# Patient Record
Sex: Female | Born: 1955 | Race: Black or African American | Hispanic: No | Marital: Single | Smoking: Current every day smoker
Health system: Southern US, Community
[De-identification: ages and names within clinical notes are randomized; demographics above are authoritative.]

## PROBLEM LIST (undated history)

## (undated) DIAGNOSIS — F251 Schizoaffective disorder, depressive type: Secondary | ICD-10-CM

## (undated) DIAGNOSIS — Z789 Other specified health status: Secondary | ICD-10-CM

## (undated) DIAGNOSIS — F99 Mental disorder, not otherwise specified: Secondary | ICD-10-CM

## (undated) DIAGNOSIS — D573 Sickle-cell trait: Secondary | ICD-10-CM

## (undated) HISTORY — PX: NO PAST SURGERIES: SHX2092

---

## 2000-08-01 ENCOUNTER — Emergency Department (HOSPITAL_COMMUNITY): Admission: EM | Admit: 2000-08-01 | Discharge: 2000-08-01 | Payer: Self-pay | Admitting: Emergency Medicine

## 2001-07-31 ENCOUNTER — Inpatient Hospital Stay (HOSPITAL_COMMUNITY): Admission: AD | Admit: 2001-07-31 | Discharge: 2001-07-31 | Payer: Self-pay | Admitting: *Deleted

## 2001-08-02 ENCOUNTER — Emergency Department (HOSPITAL_COMMUNITY): Admission: EM | Admit: 2001-08-02 | Discharge: 2001-08-02 | Payer: Self-pay | Admitting: Emergency Medicine

## 2001-10-18 ENCOUNTER — Encounter: Payer: Self-pay | Admitting: Emergency Medicine

## 2001-10-18 ENCOUNTER — Emergency Department (HOSPITAL_COMMUNITY): Admission: EM | Admit: 2001-10-18 | Discharge: 2001-10-18 | Payer: Self-pay | Admitting: Emergency Medicine

## 2004-08-09 ENCOUNTER — Emergency Department (HOSPITAL_COMMUNITY): Admission: EM | Admit: 2004-08-09 | Discharge: 2004-08-09 | Payer: Self-pay | Admitting: Emergency Medicine

## 2005-06-07 ENCOUNTER — Emergency Department (HOSPITAL_COMMUNITY): Admission: EM | Admit: 2005-06-07 | Discharge: 2005-06-07 | Payer: Self-pay | Admitting: Emergency Medicine

## 2008-08-17 ENCOUNTER — Emergency Department (HOSPITAL_COMMUNITY): Admission: EM | Admit: 2008-08-17 | Discharge: 2008-08-17 | Payer: Self-pay | Admitting: Emergency Medicine

## 2009-10-21 ENCOUNTER — Emergency Department (HOSPITAL_COMMUNITY): Admission: EM | Admit: 2009-10-21 | Discharge: 2009-10-21 | Payer: Self-pay | Admitting: Emergency Medicine

## 2010-08-09 LAB — WET PREP, GENITAL
Clue Cells Wet Prep HPF POC: NONE SEEN
Yeast Wet Prep HPF POC: NONE SEEN

## 2010-08-09 LAB — GC/CHLAMYDIA PROBE AMP, GENITAL
Chlamydia, DNA Probe: NEGATIVE
GC Probe Amp, Genital: NEGATIVE

## 2012-07-19 ENCOUNTER — Emergency Department (HOSPITAL_COMMUNITY)
Admission: EM | Admit: 2012-07-19 | Discharge: 2012-07-19 | Disposition: A | Discharge status: Home / Self Care | Payer: Medicare HMO | Attending: Emergency Medicine | Admitting: Emergency Medicine

## 2012-07-19 ENCOUNTER — Encounter (HOSPITAL_COMMUNITY): Payer: Self-pay | Admitting: Emergency Medicine

## 2012-07-19 DIAGNOSIS — N898 Other specified noninflammatory disorders of vagina: Secondary | ICD-10-CM | POA: Insufficient documentation

## 2012-07-19 DIAGNOSIS — M25519 Pain in unspecified shoulder: Secondary | ICD-10-CM | POA: Insufficient documentation

## 2012-07-19 DIAGNOSIS — F17200 Nicotine dependence, unspecified, uncomplicated: Secondary | ICD-10-CM | POA: Insufficient documentation

## 2012-07-19 DIAGNOSIS — M542 Cervicalgia: Secondary | ICD-10-CM | POA: Insufficient documentation

## 2012-07-19 DIAGNOSIS — G8929 Other chronic pain: Secondary | ICD-10-CM | POA: Insufficient documentation

## 2012-07-19 DIAGNOSIS — Z8659 Personal history of other mental and behavioral disorders: Secondary | ICD-10-CM | POA: Insufficient documentation

## 2012-07-19 DIAGNOSIS — Z862 Personal history of diseases of the blood and blood-forming organs and certain disorders involving the immune mechanism: Secondary | ICD-10-CM | POA: Insufficient documentation

## 2012-07-19 HISTORY — DX: Sickle-cell trait: D57.3

## 2012-07-19 LAB — URINALYSIS, ROUTINE W REFLEX MICROSCOPIC
Bilirubin Urine: NEGATIVE
Glucose, UA: NEGATIVE mg/dL
Ketones, ur: NEGATIVE mg/dL
Nitrite: NEGATIVE
Protein, ur: NEGATIVE mg/dL
Specific Gravity, Urine: 1.011 (ref 1.005–1.030)
Urobilinogen, UA: 1 mg/dL (ref 0.0–1.0)
pH: 6 (ref 5.0–8.0)

## 2012-07-19 LAB — URINE MICROSCOPIC-ADD ON

## 2012-07-19 NOTE — ED Notes (Addendum)
Pt c/o r shoulder and neck x1 week, reports pain is a burning sensation and is 5/10. Denies chest pain or SOB.  Pt also c/o white discharge that has turned to yellow in her vaginal area x3 weeks. Denies any difficulty urinating.
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
## 2012-07-19 NOTE — ED Notes (Signed)
MD at bedside. 

## 2012-07-20 LAB — URINE CULTURE

## 2012-07-21 ENCOUNTER — Inpatient Hospital Stay (HOSPITAL_COMMUNITY)
Admission: AD | Admit: 2012-07-21 | Discharge: 2012-07-21 | Disposition: A | Discharge status: Home / Self Care | Payer: Medicare Other | Source: Ambulatory Visit | Attending: Obstetrics & Gynecology | Admitting: Obstetrics & Gynecology

## 2012-07-21 ENCOUNTER — Encounter (HOSPITAL_COMMUNITY): Payer: Self-pay | Admitting: *Deleted

## 2012-07-21 DIAGNOSIS — A5901 Trichomonal vulvovaginitis: Secondary | ICD-10-CM | POA: Insufficient documentation

## 2012-07-21 DIAGNOSIS — N949 Unspecified condition associated with female genital organs and menstrual cycle: Secondary | ICD-10-CM | POA: Insufficient documentation

## 2012-07-21 HISTORY — DX: Mental disorder, not otherwise specified: F99

## 2012-07-21 HISTORY — DX: Other specified health status: Z78.9

## 2012-07-21 HISTORY — DX: Schizoaffective disorder, depressive type: F25.1

## 2012-07-21 LAB — URINALYSIS, ROUTINE W REFLEX MICROSCOPIC
Bilirubin Urine: NEGATIVE
Leukocytes, UA: NEGATIVE
Nitrite: NEGATIVE
Specific Gravity, Urine: 1.01 (ref 1.005–1.030)
pH: 7 (ref 5.0–8.0)

## 2012-07-21 LAB — WET PREP, GENITAL

## 2012-07-21 MED ORDER — METRONIDAZOLE 500 MG PO TABS: 500.0000 mg | ORAL_TABLET | Freq: Two times a day (BID) | ORAL | Status: DC

## 2012-07-21 NOTE — MAU Provider Note (Signed)
History     CSN: [id number]  Arrival date and time: 07/21/12 0758   None     Chief Complaint  Patient presents with  . Vaginal Discharge   HPI Jacqueline Hunter is 57 y.o. G3P0 presenting with vaginal discharge for 1 month.  Described as white with intermittent itching and odor.  States she was last sexually active 4 months ago with a new partner.  Last menstrual cycle 17 years ago. Denies hysterectomy. Last pap smear was 2 years ago.  She was seen at [facility] recently with same sxs but pelvic exam was very uncomfortable for her and MD was unable to complete.     Past Medical History  Diagnosis Date  . Sickle cell trait   . Medical history non-contributory   . Mental disorder   . Schizophrenia, schizo-affective type, depressed     Past Surgical History  Procedure Laterality Date  . No past surgeries      History reviewed. No pertinent family history.  History  Substance Use Topics  . Smoking status: Current Every Day Smoker -- 1.00 packs/day  . Smokeless tobacco: Not on file  . Alcohol Use: Yes    Allergies: No Known Allergies  No prescriptions prior to admission    Review of Systems  Constitutional: Negative.   HENT: Negative.   Respiratory: Negative.   Cardiovascular: Negative.   Gastrointestinal: Negative for abdominal pain.  Genitourinary: Negative for dysuria and urgency.       + for vaginal discharge with intermittent odor and itching   Physical Exam   Blood pressure 136/70, pulse 98, temperature 98.3 F (36.8 C), temperature source Oral, resp. rate 18, height 5\' 2"  (1.575 m), weight 105 lb 2 oz (47.684 kg).  Physical Exam  Constitutional: She appears well-developed and well-nourished. No distress.  HENT:  Head: Normocephalic.  Neck: Normal range of motion.  Cardiovascular: Normal rate.   Respiratory: Effort normal.  GI: Soft. She exhibits no distension and no mass. There is no tenderness. There is no rebound and no guarding.  Genitourinary: There is no  rash, tenderness or lesion on the right labia. There is no rash, tenderness or lesion on the left labia. Uterus is not enlarged and not tender. Cervix exhibits no motion tenderness, no discharge and no friability. Right adnexum displays no mass, no tenderness and no fullness. Left adnexum displays no mass, no tenderness and no fullness. No erythema, tenderness or bleeding around the vagina. Vaginal discharge (frothy discharge with odor) found.   Results for orders placed during the hospital encounter of 07/21/12 (from the past 24 hour(s))  URINALYSIS, ROUTINE W REFLEX MICROSCOPIC     Status: Abnormal   Collection Time    07/21/12  8:55 AM      Result Value Range   Color, Urine YELLOW  YELLOW   APPearance CLEAR  CLEAR   Specific Gravity, Urine 1.010  1.005 - 1.030   pH 7.0  5.0 - 8.0   Glucose, UA NEGATIVE  NEGATIVE mg/dL   Hgb urine dipstick TRACE (*) NEGATIVE   Bilirubin Urine NEGATIVE  NEGATIVE   Ketones, ur NEGATIVE  NEGATIVE mg/dL   Protein, ur NEGATIVE  NEGATIVE mg/dL   Urobilinogen, UA 0.2  0.0 - 1.0 mg/dL   Nitrite NEGATIVE  NEGATIVE   Leukocytes, UA NEGATIVE  NEGATIVE  WET PREP, GENITAL     Status: Abnormal   Collection Time    07/21/12  8:55 AM      Result Value Range   Yeast Wet  Prep HPF POC NONE SEEN  NONE SEEN   Trich, Wet Prep RARE (*) NONE SEEN   Clue Cells Wet Prep HPF POC RARE (*) NONE SEEN   WBC, Wet Prep HPF POC MODERATE (*) NONE SEEN  URINE MICROSCOPIC-ADD ON     Status: Abnormal   Collection Time    07/21/12  8:55 AM      Result Value Range   Squamous Epithelial / LPF FEW (*) RARE   RBC / HPF 0-2  <3 RBC/hpf    MAU Course  Procedures  GC/CHL culture to lab  MDM  Assessment and Plan  A:  Trichomonal vaginitis  P:  Rx for Flagyl X 1 week  ETOH warning     Strongly encouraged condoms for sexual activity      Instructed patient that her partner needs treatment before resuming intercourse to prevent re-infection   KEY,EVE M 07/21/2012, 8:38 AM

## 2012-07-21 NOTE — MAU Provider Note (Signed)
Attestation of Attending Supervision of Advanced Practitioner (PA/CNM/NP): Evaluation and management procedures were performed by the Advanced Practitioner under my supervision and collaboration.  I have reviewed the Advanced Practitioner's note and chart, and I agree with the management and plan.  Karin Pinedo, MD, FACOG Attending Obstetrician & Gynecologist Faculty Practice, [facility]  

## 2012-07-21 NOTE — MAU Note (Signed)
Pt. States that she has been having a white discharge for the last month.  She is having a small amount of vaginal pain and itching.

## 2012-07-23 LAB — GC/CHLAMYDIA PROBE AMP: GC Probe RNA: NEGATIVE

## 2012-08-01 ENCOUNTER — Emergency Department (INDEPENDENT_AMBULATORY_CARE_PROVIDER_SITE_OTHER)
Admission: EM | Admit: 2012-08-01 | Discharge: 2012-08-01 | Disposition: A | Discharge status: Home / Self Care | Payer: Medicare Other | Source: Home / Self Care

## 2012-08-01 ENCOUNTER — Encounter (HOSPITAL_COMMUNITY): Payer: Self-pay | Admitting: Emergency Medicine

## 2012-08-01 DIAGNOSIS — Z5189 Encounter for other specified aftercare: Secondary | ICD-10-CM | POA: Diagnosis not present

## 2012-08-01 DIAGNOSIS — S46812D Strain of other muscles, fascia and tendons at shoulder and upper arm level, left arm, subsequent encounter: Secondary | ICD-10-CM

## 2012-08-01 DIAGNOSIS — S161XXD Strain of muscle, fascia and tendon at neck level, subsequent encounter: Secondary | ICD-10-CM

## 2012-08-01 MED ORDER — MELOXICAM 15 MG PO TABS: 15.0000 mg | ORAL_TABLET | Freq: Every day | ORAL | Status: DC

## 2012-08-01 MED ORDER — TRAMADOL HCL 50 MG PO TABS: 50.0000 mg | ORAL_TABLET | Freq: Four times a day (QID) | ORAL | Status: DC | PRN

## 2012-08-01 NOTE — ED Provider Notes (Signed)
Medical screening examination/treatment/procedure(s) were performed by non-physician practitioner and as supervising physician I was immediately available for consultation/collaboration.  Leslee Home, M.D.   Reuben Likes, MD 08/01/12 2056

## 2012-08-01 NOTE — ED Provider Notes (Signed)
History     CSN: [id number]  Arrival date & time 08/01/12  1133   First MD Initiated Contact with Patient 08/01/12 1158      Chief Complaint  Patient presents with  . Arm Pain  . Shoulder Pain    (Consider location/radiation/quality/duration/timing/severity/associated sxs/prior treatment) HPI Comments: 57 year old female who appears 10 years older the stated age presents to the urgent care for her chronic right neck and shoulder pain. This began approximately 20 years ago but she has intermittent flareups. She was seen at [facility] one to 2 weeks ago for the same problem. She complains of pain in the right lateral cervical musculature and trapezius muscle. The pain is worse when rotating head to the right and flexing neck. She is able to raise her arm over her head but that also causes pain. She denies new injury or trauma.  Patient is a 57 y.o. female presenting with arm pain and shoulder pain. The history is provided by the patient.  Arm Pain This is a chronic problem. The problem occurs every several days. The problem has been gradually worsening. Pertinent negatives include no chest pain, no abdominal pain, no headaches and no shortness of breath. The symptoms are aggravated by exertion. Nothing relieves the symptoms.  Shoulder Pain Pertinent negatives include no chest pain, no abdominal pain, no headaches and no shortness of breath.    Past Medical History  Diagnosis Date  . Sickle cell trait   . Medical history non-contributory   . Mental disorder   . Schizophrenia, schizo-affective type, depressed     Past Surgical History  Procedure Laterality Date  . No past surgeries      No family history on file.  History  Substance Use Topics  . Smoking status: Current Every Day Smoker -- 1.00 packs/day  . Smokeless tobacco: Not on file  . Alcohol Use: Yes    OB History   Grav Para Term Preterm Abortions TAB SAB Ect Mult Living   3         3      Review of  Systems  Constitutional: Negative for fever, chills and activity change.  HENT: Positive for neck pain. Negative for nosebleeds, sore throat, trouble swallowing and ear discharge.   Respiratory: Negative.  Negative for shortness of breath.   Cardiovascular: Negative.  Negative for chest pain.  Gastrointestinal: Negative for abdominal pain.  Musculoskeletal:       As per HPI  Skin: Negative for color change, pallor and rash.  Neurological: Negative.  Negative for headaches.  Psychiatric/Behavioral: Positive for behavioral problems and agitation. The patient is nervous/anxious.     Allergies  Review of patient's allergies indicates no known allergies.  Home Medications   Current Outpatient Rx  Name  Route  Sig  Dispense  Refill  . meloxicam (MOBIC) 15 MG tablet   Oral   Take 1 tablet (15 mg total) by mouth daily.   14 tablet   0   . metroNIDAZOLE (FLAGYL) 500 MG tablet   Oral   Take 1 tablet (500 mg total) by mouth 2 (two) times daily.   14 tablet   0   . traMADol (ULTRAM) 50 MG tablet   Oral   Take 1 tablet (50 mg total) by mouth every 6 (six) hours as needed for pain.   15 tablet   0     BP 155/86  Pulse 80  Temp(Src) 98.4 F (36.9 C) (Oral)  Resp 18  SpO2 99%  Physical Exam  Nursing note and vitals reviewed. Constitutional: She is oriented to person, place, and time. She appears well-developed and well-nourished. No distress.  HENT:  Head: Normocephalic and atraumatic.  Right Ear: External ear normal.  Left Ear: External ear normal.  Mouth/Throat: Oropharynx is clear and moist. No oropharyngeal exudate.  Eyes: EOM are normal. Pupils are equal, round, and reactive to light.  Neck: Normal range of motion. Neck supple.  Cardiovascular: Normal rate and normal heart sounds.   Pulmonary/Chest: Effort normal and breath sounds normal. No respiratory distress.  Lymphadenopathy:    She has no cervical adenopathy.  Neurological: She is alert and oriented to person,  place, and time. No cranial nerve deficit.  Skin: Skin is warm and dry.  Psychiatric: Her speech is normal. Her mood appears anxious. Her affect is labile.  Histrionic,crying at times. emotionally labile    ED Course  Procedures (including critical care time)  Labs Reviewed - No data to display No results found.   1. Cervicalgia   2. Trapezius strain, left, subsequent encounter   3. Cervical muscle strain, subsequent encounter       MDM  Mobic 15 mg one daily when necessary pain take with food Tramadol 50 mg one every 4-6 hours when necessary pain #15 He must obtain a primary care doctor to help with her chronic pain, health maintenance and other problems. Apply heat to the neck muscles to help with pain and relaxation. Stop performing the job duties and working but she states makes the pain worse. The         Hayden Rasmussen, NP 08/01/12 1427

## 2012-08-01 NOTE — ED Notes (Signed)
Pt now states chest pain and possible poisoning... I have made Pt aware that of this information was going to be passed on the her Shacoria Latif and that they would be asking further questions about these changes. Pt's CMA and NP are informed of Pt's complains.
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
## 2012-08-03 ENCOUNTER — Telehealth (HOSPITAL_COMMUNITY): Payer: Self-pay | Admitting: *Deleted

## 2012-08-03 NOTE — ED Notes (Signed)
Pt. called on VM and asked for a refill of her pain medication. Also btated it does not help very much.  I called pt. back and told her we don't do refills because we are not a primary care facility.  Pt. instructed to get a PCP. States she has an appointment with [facility] 4/9. I told her to call them and tell them she is having pain. If they can't see you sooner then come back for a recheck. Pt. voiced understanding. Vassie Moselle 08/03/2012

## 2012-08-04 ENCOUNTER — Encounter (HOSPITAL_COMMUNITY): Payer: Self-pay | Admitting: Emergency Medicine

## 2012-08-04 ENCOUNTER — Emergency Department (HOSPITAL_COMMUNITY)
Admission: EM | Admit: 2012-08-04 | Discharge: 2012-08-04 | Disposition: A | Discharge status: Home / Self Care | Payer: Medicare Other | Attending: Emergency Medicine | Admitting: Emergency Medicine

## 2012-08-04 DIAGNOSIS — M62838 Other muscle spasm: Secondary | ICD-10-CM | POA: Insufficient documentation

## 2012-08-04 DIAGNOSIS — F17200 Nicotine dependence, unspecified, uncomplicated: Secondary | ICD-10-CM | POA: Diagnosis not present

## 2012-08-04 DIAGNOSIS — Z862 Personal history of diseases of the blood and blood-forming organs and certain disorders involving the immune mechanism: Secondary | ICD-10-CM | POA: Insufficient documentation

## 2012-08-04 DIAGNOSIS — M542 Cervicalgia: Secondary | ICD-10-CM | POA: Insufficient documentation

## 2012-08-04 DIAGNOSIS — Z79899 Other long term (current) drug therapy: Secondary | ICD-10-CM | POA: Insufficient documentation

## 2012-08-04 DIAGNOSIS — Z8659 Personal history of other mental and behavioral disorders: Secondary | ICD-10-CM | POA: Insufficient documentation

## 2012-08-04 DIAGNOSIS — M5380 Other specified dorsopathies, site unspecified: Secondary | ICD-10-CM | POA: Diagnosis not present

## 2012-08-04 MED ORDER — CYCLOBENZAPRINE HCL 10 MG PO TABS: 10.0000 mg | ORAL_TABLET | Freq: Two times a day (BID) | ORAL | Status: DC | PRN

## 2012-08-04 NOTE — ED Provider Notes (Signed)
History  This chart was scribed for non-physician practitioner working with Dierdre Forth, PA-C/Brian Alanda Amass, MD, by Candelaria Stagers, ED Scribe. This patient was seen in room TR08C/TR08C and the patient's care was started at 5:54 PM   CSN: [id number]  Arrival date & time 08/04/12  1409   First MD Initiated Contact with Patient 08/04/12 1718      Chief Complaint  Patient presents with  . Shoulder Pain     The history is provided by the patient. No language interpreter was used.   Jacqueline Hunter is a 57 y.o. female who presents to the Emergency Department complaining continued right shoulder pain that started about two months ago after she hit the shoulder on a door frame.  She is also experiencing left sided neck pain.  She reports the pain subsided, but returned about one month ago.  She has been seen in the ED twice for the sx and has been prescribed Tramadol and Meloxicam with no relief.  She has also taken tylenol with no relief.  Pt reports she has not taken the Meloxicam consistently.  The pain is worse when turning her head.          Past Medical History  Diagnosis Date  . Sickle cell trait   . Medical history non-contributory   . Mental disorder   . Schizophrenia, schizo-affective type, depressed     Past Surgical History  Procedure Laterality Date  . No past surgeries      No family history on file.  History  Substance Use Topics  . Smoking status: Current Every Day Smoker -- 1.00 packs/day  . Smokeless tobacco: Not on file  . Alcohol Use: Yes    OB History   Grav Para Term Preterm Abortions TAB SAB Ect Mult Living   3         3      Review of Systems  HENT: Positive for neck pain (left sided neck pain).   Musculoskeletal: Positive for arthralgias (right shoulder pain).  All other systems reviewed and are negative.    Allergies  Review of patient's allergies indicates no known allergies.  Home Medications   Current Outpatient Rx  Name  Route   Sig  Dispense  Refill  . meloxicam (MOBIC) 15 MG tablet   Oral   Take 1 tablet (15 mg total) by mouth daily.   14 tablet   0   . traMADol (ULTRAM) 50 MG tablet   Oral   Take 1 tablet (50 mg total) by mouth every 6 (six) hours as needed for pain.   15 tablet   0   . cyclobenzaprine (FLEXERIL) 10 MG tablet   Oral   Take 1 tablet (10 mg total) by mouth 2 (two) times daily as needed for muscle spasms.   20 tablet   0     BP 166/110  Pulse 92  Temp(Src) 98.2 F (36.8 C) (Oral)  Resp 20  SpO2 98%  Physical Exam  Nursing note and vitals reviewed. Constitutional: She is oriented to person, place, and time. She appears well-developed and well-nourished. No distress.  HENT:  Head: Normocephalic and atraumatic.  Mouth/Throat: Oropharynx is clear and moist. No oropharyngeal exudate.  Eyes: Conjunctivae and EOM are normal. Pupils are equal, round, and reactive to light. No scleral icterus.  Neck: Neck supple. Muscular tenderness present. No spinous process tenderness present. Decreased range of motion (2/2 pain) present.  Cardiovascular: Normal rate, regular rhythm, normal heart sounds and intact distal  pulses.  Exam reveals no gallop and no friction rub.   No murmur heard. Pulmonary/Chest: Effort normal and breath sounds normal. No respiratory distress. She has no wheezes. She has no rales. She exhibits no tenderness.  Musculoskeletal: She exhibits no edema.       Right shoulder: She exhibits tenderness, pain and spasm. She exhibits normal range of motion.       Cervical back: She exhibits tenderness, pain and spasm.  Tenderness to right supraspinatus muscle.  No para spinal tenderness or midline tenderness.  Cap refill less than 3 seconds.    Lymphadenopathy:    She has no cervical adenopathy.  Neurological: She is alert and oriented to person, place, and time. She exhibits normal muscle tone. Coordination normal.  5/5 strength of upper extremities.  Normal sensation.  Speech is  clear and goal oriented Moves extremities without ataxia  Skin: Skin is warm and dry. She is not diaphoretic. No erythema.  Psychiatric: She has a normal mood and affect.    ED Course  Procedures   DIAGNOSTIC STUDIES: Oxygen Saturation is 98% on room air, normal by my interpretation.    COORDINATION OF CARE:  6:02 PM Discussed course of care with pt which includes muscle relaxer, continued use of meloxicam, and ibuprofen as needed.  Pt understands and agrees.    Labs Reviewed - No data to display No results found.   1. Cervical muscle pain   2. Cervical paraspinal muscle spasm       MDM  Ottis Stain presents with right shoulder and cervical spine pain.  No neurological deficits and normal neuro exam.  Reviewed x-ray from previous visit which showed degenerative changes but no acute fractures. I do not believe that further imaging is warranted today.  Patient can walk without difficulty.  No fever, night sweats, weight loss, h/o cancer, IVDU.  RICE protocol and pain medicine indicated and discussed with patient.  Will d/c home with flexeril and have her continue to take her Mobic as prescribed.    1. Medications: flexeril,  usual home medications including MObic 2. Treatment: rest, drink plenty of fluids, gentle stretching as discussed, alternate ice and heat 3. Follow Up: Please followup with your primary doctor for discussion of your diagnoses and further evaluation after today's visit; if you do not have a primary care doctor use the resource guide provided to find one; follow-up with ortho as needed if your symptoms persist.     I personally performed the services described in this documentation, which was scribed in my presence. The recorded information has been reviewed and is accurate.   Dahlia Client Fanta Wimberley, PA-C 08/04/12 1848

## 2012-08-04 NOTE — ED Notes (Signed)
Pt st's she hit her right shoulder on a door frame over a month ago and has continued pain.  Pt st's she has been to several hosp. But medications they give her is not working (Tramadol, Meloxicam).

## 2012-08-05 NOTE — ED Provider Notes (Signed)
  Medical screening examination/treatment/procedure(s) were performed by non-physician practitioner and as supervising physician I was immediately available for consultation/collaboration.    Vida Roller, MD 08/05/12 0000

## 2012-08-31 DIAGNOSIS — E781 Pure hyperglyceridemia: Secondary | ICD-10-CM | POA: Diagnosis not present

## 2012-08-31 DIAGNOSIS — F3289 Other specified depressive episodes: Secondary | ICD-10-CM | POA: Diagnosis not present

## 2012-08-31 DIAGNOSIS — F329 Major depressive disorder, single episode, unspecified: Secondary | ICD-10-CM | POA: Diagnosis not present

## 2012-09-03 DIAGNOSIS — E781 Pure hyperglyceridemia: Secondary | ICD-10-CM | POA: Diagnosis not present

## 2012-09-03 DIAGNOSIS — E559 Vitamin D deficiency, unspecified: Secondary | ICD-10-CM | POA: Diagnosis not present

## 2012-10-16 ENCOUNTER — Encounter (HOSPITAL_COMMUNITY): Payer: Self-pay

## 2012-10-16 ENCOUNTER — Emergency Department (HOSPITAL_COMMUNITY)
Admission: EM | Admit: 2012-10-16 | Discharge: 2012-10-16 | Discharge status: Against Medical Advice | Payer: Medicare Other | Attending: Emergency Medicine | Admitting: Emergency Medicine

## 2012-10-16 DIAGNOSIS — H538 Other visual disturbances: Secondary | ICD-10-CM | POA: Diagnosis not present

## 2012-10-16 DIAGNOSIS — H5710 Ocular pain, unspecified eye: Secondary | ICD-10-CM | POA: Diagnosis not present

## 2012-10-16 DIAGNOSIS — H579 Unspecified disorder of eye and adnexa: Secondary | ICD-10-CM | POA: Insufficient documentation

## 2012-10-16 DIAGNOSIS — H5789 Other specified disorders of eye and adnexa: Secondary | ICD-10-CM | POA: Diagnosis not present
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
## 2012-10-16 NOTE — ED Notes (Signed)
Pt left before being seen by PA.

## 2012-10-17 ENCOUNTER — Emergency Department (HOSPITAL_COMMUNITY)
Admission: EM | Admit: 2012-10-17 | Discharge: 2012-10-17 | Disposition: A | Discharge status: Home / Self Care | Payer: Medicare Other | Attending: Emergency Medicine | Admitting: Emergency Medicine

## 2012-10-17 ENCOUNTER — Encounter (HOSPITAL_COMMUNITY): Payer: Self-pay | Admitting: Emergency Medicine

## 2012-10-17 DIAGNOSIS — Z79899 Other long term (current) drug therapy: Secondary | ICD-10-CM | POA: Diagnosis not present

## 2012-10-17 DIAGNOSIS — F17200 Nicotine dependence, unspecified, uncomplicated: Secondary | ICD-10-CM | POA: Diagnosis not present

## 2012-10-17 DIAGNOSIS — Z8659 Personal history of other mental and behavioral disorders: Secondary | ICD-10-CM | POA: Insufficient documentation

## 2012-10-17 DIAGNOSIS — T1590XA Foreign body on external eye, part unspecified, unspecified eye, initial encounter: Secondary | ICD-10-CM | POA: Insufficient documentation

## 2012-10-17 DIAGNOSIS — Y929 Unspecified place or not applicable: Secondary | ICD-10-CM | POA: Insufficient documentation

## 2012-10-17 DIAGNOSIS — H5789 Other specified disorders of eye and adnexa: Secondary | ICD-10-CM

## 2012-10-17 DIAGNOSIS — Z862 Personal history of diseases of the blood and blood-forming organs and certain disorders involving the immune mechanism: Secondary | ICD-10-CM | POA: Insufficient documentation

## 2012-10-17 DIAGNOSIS — Y9389 Activity, other specified: Secondary | ICD-10-CM | POA: Insufficient documentation

## 2012-10-17 MED ORDER — FLUORESCEIN SODIUM 1 MG OP STRP
ORAL_STRIP | OPHTHALMIC | Status: AC
  Administered 2012-10-17: 06:00:00
  Filled 2012-10-17: qty 1

## 2012-10-17 MED ORDER — TETRACAINE HCL 0.5 % OP SOLN
1.0000 [drp] | Freq: Once | OPHTHALMIC | Status: AC
  Administered 2012-10-17: 1 [drp] via OPHTHALMIC
  Filled 2012-10-17: qty 2

## 2012-10-17 NOTE — ED Notes (Signed)
Patient given discharge instructions for irritation of eye. No rx was provided. Advised to continue to flush eye and if condition worsens to return to this department. Patient voiced understanding of all instructions and had no further questions. Patient ambulated to front lobby without difficulty.
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
## 2012-10-17 NOTE — ED Notes (Signed)
Vision: Left eye 20/20             Right 20/20             Both 20/20

## 2012-10-18 NOTE — ED Provider Notes (Signed)
Medical screening examination/treatment/procedure(s) were performed by non-physician practitioner and as supervising physician I was immediately available for consultation/collaboration.  Sunnie Nielsen, MD 10/18/12 0005

## 2013-08-23 ENCOUNTER — Encounter (HOSPITAL_COMMUNITY): Payer: Self-pay | Admitting: Emergency Medicine

## 2013-08-23 ENCOUNTER — Emergency Department (HOSPITAL_COMMUNITY)
Admission: EM | Admit: 2013-08-23 | Discharge: 2013-08-23 | Disposition: A | Discharge status: Home / Self Care | Payer: No Typology Code available for payment source | Attending: Emergency Medicine | Admitting: Emergency Medicine

## 2013-08-23 DIAGNOSIS — Z862 Personal history of diseases of the blood and blood-forming organs and certain disorders involving the immune mechanism: Secondary | ICD-10-CM | POA: Diagnosis not present

## 2013-08-23 DIAGNOSIS — S139XXA Sprain of joints and ligaments of unspecified parts of neck, initial encounter: Secondary | ICD-10-CM | POA: Diagnosis not present

## 2013-08-23 DIAGNOSIS — Z8659 Personal history of other mental and behavioral disorders: Secondary | ICD-10-CM | POA: Diagnosis not present

## 2013-08-23 DIAGNOSIS — F17200 Nicotine dependence, unspecified, uncomplicated: Secondary | ICD-10-CM | POA: Insufficient documentation

## 2013-08-23 DIAGNOSIS — Y9389 Activity, other specified: Secondary | ICD-10-CM | POA: Diagnosis not present

## 2013-08-23 DIAGNOSIS — Y92410 Unspecified street and highway as the place of occurrence of the external cause: Secondary | ICD-10-CM | POA: Insufficient documentation

## 2013-08-23 DIAGNOSIS — S335XXA Sprain of ligaments of lumbar spine, initial encounter: Secondary | ICD-10-CM | POA: Diagnosis not present

## 2013-08-23 DIAGNOSIS — IMO0002 Reserved for concepts with insufficient information to code with codable children: Secondary | ICD-10-CM | POA: Insufficient documentation

## 2013-08-23 DIAGNOSIS — T148XXA Other injury of unspecified body region, initial encounter: Secondary | ICD-10-CM

## 2013-08-23 MED ORDER — METHOCARBAMOL 500 MG PO TABS: 1000.0000 mg | ORAL_TABLET | Freq: Four times a day (QID) | ORAL | Status: DC

## 2013-08-23 MED ORDER — NAPROXEN 500 MG PO TABS: 500.0000 mg | ORAL_TABLET | Freq: Two times a day (BID) | ORAL | Status: DC
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
## 2013-08-29 NOTE — ED Provider Notes (Signed)
Medical screening examination/treatment/procedure(s) were performed by non-physician practitioner and as supervising physician I was immediately available for consultation/collaboration.  Jerriann Schrom L Blondie Riggsbee, MD 08/29/13 1712 

## 2014-03-24 ENCOUNTER — Encounter (HOSPITAL_COMMUNITY): Payer: Self-pay | Admitting: Emergency Medicine

## 2014-04-19 ENCOUNTER — Emergency Department (HOSPITAL_COMMUNITY)
Admission: EM | Admit: 2014-04-19 | Discharge: 2014-04-19 | Disposition: A | Discharge status: Home / Self Care | Payer: No Typology Code available for payment source | Attending: Emergency Medicine | Admitting: Emergency Medicine

## 2014-04-19 DIAGNOSIS — Y998 Other external cause status: Secondary | ICD-10-CM | POA: Diagnosis not present

## 2014-04-19 DIAGNOSIS — S0990XA Unspecified injury of head, initial encounter: Secondary | ICD-10-CM | POA: Diagnosis present

## 2014-04-19 DIAGNOSIS — S3992XA Unspecified injury of lower back, initial encounter: Secondary | ICD-10-CM | POA: Insufficient documentation

## 2014-04-19 DIAGNOSIS — Y9389 Activity, other specified: Secondary | ICD-10-CM | POA: Diagnosis not present

## 2014-04-19 DIAGNOSIS — Z720 Tobacco use: Secondary | ICD-10-CM | POA: Diagnosis not present

## 2014-04-19 DIAGNOSIS — Y92410 Unspecified street and highway as the place of occurrence of the external cause: Secondary | ICD-10-CM | POA: Diagnosis not present

## 2014-04-19 DIAGNOSIS — S99911A Unspecified injury of right ankle, initial encounter: Secondary | ICD-10-CM | POA: Insufficient documentation

## 2014-04-19 DIAGNOSIS — M549 Dorsalgia, unspecified: Secondary | ICD-10-CM

## 2014-04-19 DIAGNOSIS — Z791 Long term (current) use of non-steroidal anti-inflammatories (NSAID): Secondary | ICD-10-CM | POA: Diagnosis not present

## 2014-04-19 DIAGNOSIS — S4992XA Unspecified injury of left shoulder and upper arm, initial encounter: Secondary | ICD-10-CM | POA: Insufficient documentation

## 2014-04-19 DIAGNOSIS — M542 Cervicalgia: Secondary | ICD-10-CM

## 2014-04-19 DIAGNOSIS — Z79899 Other long term (current) drug therapy: Secondary | ICD-10-CM | POA: Diagnosis not present

## 2014-04-19 DIAGNOSIS — S9001XA Contusion of right ankle, initial encounter: Secondary | ICD-10-CM | POA: Diagnosis not present

## 2014-04-19 DIAGNOSIS — S199XXA Unspecified injury of neck, initial encounter: Secondary | ICD-10-CM | POA: Diagnosis not present

## 2014-04-19 DIAGNOSIS — M545 Low back pain: Secondary | ICD-10-CM | POA: Diagnosis not present

## 2014-04-19 DIAGNOSIS — Z862 Personal history of diseases of the blood and blood-forming organs and certain disorders involving the immune mechanism: Secondary | ICD-10-CM | POA: Insufficient documentation

## 2014-04-19 DIAGNOSIS — R519 Headache, unspecified: Secondary | ICD-10-CM

## 2014-04-19 DIAGNOSIS — Z8659 Personal history of other mental and behavioral disorders: Secondary | ICD-10-CM | POA: Insufficient documentation

## 2014-04-19 DIAGNOSIS — R51 Headache: Secondary | ICD-10-CM | POA: Diagnosis not present

## 2014-04-19 DIAGNOSIS — M25571 Pain in right ankle and joints of right foot: Secondary | ICD-10-CM

## 2014-04-19 MED ORDER — TRAMADOL HCL 50 MG PO TABS
50.0000 mg | ORAL_TABLET | Freq: Once | ORAL | Status: AC
  Administered 2014-04-19: 50 mg via ORAL
  Filled 2014-04-19: qty 1

## 2014-04-19 MED ORDER — METHOCARBAMOL 500 MG PO TABS: 500.0000 mg | ORAL_TABLET | Freq: Two times a day (BID) | ORAL | Status: DC

## 2014-04-19 MED ORDER — NAPROXEN 500 MG PO TABS: 500.0000 mg | ORAL_TABLET | Freq: Two times a day (BID) | ORAL | Status: DC

## 2014-04-19 MED ORDER — ACETAMINOPHEN 325 MG PO TABS
650.0000 mg | ORAL_TABLET | Freq: Once | ORAL | Status: AC
Start: 2014-04-19 — End: 2014-04-19
  Administered 2014-04-19: 650 mg via ORAL
  Filled 2014-04-19: qty 2

## 2014-04-19 MED ORDER — TRAMADOL-ACETAMINOPHEN 37.5-325 MG PO TABS: 1.0000 | ORAL_TABLET | Freq: Four times a day (QID) | ORAL | Status: DC | PRN

## 2014-04-19 NOTE — ED Notes (Signed)
Pt states she does not want any xrays.
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
## 2014-04-19 NOTE — ED Notes (Signed)
Pt given a gown to place on and a warm blanket

## 2014-04-19 NOTE — Discharge Instructions (Signed)
Take the prescribed medication as directed. °Follow-up with your primary care physician. °Return to the ED for new or worsening symptoms. ° °
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
## 2014-04-19 NOTE — ED Notes (Signed)
Signature pad not working patient verbalized understanding of discharge instructions.  

## 2014-09-28 ENCOUNTER — Emergency Department (HOSPITAL_COMMUNITY): Payer: Medicare Other

## 2014-09-28 ENCOUNTER — Emergency Department (HOSPITAL_COMMUNITY)
Admission: EM | Admit: 2014-09-28 | Discharge: 2014-09-28 | Disposition: A | Discharge status: Home / Self Care | Payer: Medicare Other | Attending: Emergency Medicine | Admitting: Emergency Medicine

## 2014-09-28 ENCOUNTER — Encounter (HOSPITAL_COMMUNITY): Payer: Self-pay | Admitting: Emergency Medicine

## 2014-09-28 DIAGNOSIS — Z862 Personal history of diseases of the blood and blood-forming organs and certain disorders involving the immune mechanism: Secondary | ICD-10-CM | POA: Diagnosis not present

## 2014-09-28 DIAGNOSIS — S161XXA Strain of muscle, fascia and tendon at neck level, initial encounter: Secondary | ICD-10-CM | POA: Insufficient documentation

## 2014-09-28 DIAGNOSIS — Y998 Other external cause status: Secondary | ICD-10-CM | POA: Insufficient documentation

## 2014-09-28 DIAGNOSIS — S199XXA Unspecified injury of neck, initial encounter: Secondary | ICD-10-CM | POA: Diagnosis present

## 2014-09-28 DIAGNOSIS — S39012A Strain of muscle, fascia and tendon of lower back, initial encounter: Secondary | ICD-10-CM

## 2014-09-28 DIAGNOSIS — Z79899 Other long term (current) drug therapy: Secondary | ICD-10-CM | POA: Insufficient documentation

## 2014-09-28 DIAGNOSIS — S4991XA Unspecified injury of right shoulder and upper arm, initial encounter: Secondary | ICD-10-CM | POA: Insufficient documentation

## 2014-09-28 DIAGNOSIS — Y9389 Activity, other specified: Secondary | ICD-10-CM | POA: Diagnosis not present

## 2014-09-28 DIAGNOSIS — Z720 Tobacco use: Secondary | ICD-10-CM | POA: Insufficient documentation

## 2014-09-28 DIAGNOSIS — Y92410 Unspecified street and highway as the place of occurrence of the external cause: Secondary | ICD-10-CM | POA: Insufficient documentation

## 2014-09-28 DIAGNOSIS — Z8659 Personal history of other mental and behavioral disorders: Secondary | ICD-10-CM | POA: Insufficient documentation

## 2014-09-28 DIAGNOSIS — S3992XA Unspecified injury of lower back, initial encounter: Secondary | ICD-10-CM | POA: Diagnosis not present

## 2014-09-28 MED ORDER — TRAMADOL HCL 50 MG PO TABS: 50.0000 mg | ORAL_TABLET | Freq: Four times a day (QID) | ORAL | Status: DC | PRN

## 2014-09-28 MED ORDER — IBUPROFEN 800 MG PO TABS: 800.0000 mg | ORAL_TABLET | Freq: Three times a day (TID) | ORAL | Status: DC | PRN

## 2014-09-28 NOTE — Discharge Instructions (Signed)
Your x-rays here tonight were normal.  Follow-up with your primary care doctor.  Use ice and heat on the areas that are sore
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
## 2014-09-28 NOTE — ED Notes (Signed)
Patient discharged voiced understanding 
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
## 2014-11-14 ENCOUNTER — Other Ambulatory Visit (HOSPITAL_COMMUNITY)
Admission: RE | Admit: 2014-11-14 | Discharge: 2014-11-14 | Disposition: A | Discharge status: Home / Self Care | Payer: Medicare Other | Source: Ambulatory Visit | Attending: Family Medicine | Admitting: Family Medicine

## 2014-11-14 ENCOUNTER — Encounter (HOSPITAL_COMMUNITY): Payer: Self-pay | Admitting: Emergency Medicine

## 2014-11-14 ENCOUNTER — Emergency Department (INDEPENDENT_AMBULATORY_CARE_PROVIDER_SITE_OTHER)
Admission: EM | Admit: 2014-11-14 | Discharge: 2014-11-14 | Disposition: A | Discharge status: Home / Self Care | Payer: Medicare Other | Source: Home / Self Care | Attending: Family Medicine | Admitting: Family Medicine

## 2014-11-14 DIAGNOSIS — K219 Gastro-esophageal reflux disease without esophagitis: Secondary | ICD-10-CM | POA: Diagnosis not present

## 2014-11-14 DIAGNOSIS — N760 Acute vaginitis: Secondary | ICD-10-CM | POA: Diagnosis not present

## 2014-11-14 DIAGNOSIS — Z113 Encounter for screening for infections with a predominantly sexual mode of transmission: Secondary | ICD-10-CM | POA: Insufficient documentation

## 2014-11-14 DIAGNOSIS — I10 Essential (primary) hypertension: Secondary | ICD-10-CM | POA: Diagnosis not present

## 2014-11-14 LAB — POCT URINALYSIS DIP (DEVICE)
GLUCOSE, UA: 100 mg/dL — AB
Ketones, ur: NEGATIVE mg/dL
Nitrite: NEGATIVE
PROTEIN: NEGATIVE mg/dL
SPECIFIC GRAVITY, URINE: 1.015 (ref 1.005–1.030)
Urobilinogen, UA: 4 mg/dL — ABNORMAL HIGH (ref 0.0–1.0)
pH: 6 (ref 5.0–8.0)

## 2014-11-14 MED ORDER — METRONIDAZOLE 500 MG PO TABS: 500.0000 mg | ORAL_TABLET | Freq: Two times a day (BID) | ORAL | Status: DC

## 2014-11-14 NOTE — Discharge Instructions (Signed)
Thank you for coming in today.  Do not drink alcohol with this medicine.   Vaginitis Vaginitis is an inflammation of the vagina. It is most often caused by a change in the normal balance of the bacteria and yeast that live in the vagina. This change in balance causes an overgrowth of certain bacteria or yeast, which causes the inflammation. There are different types of vaginitis, but the most common types are:  Bacterial vaginosis.  Yeast infection (candidiasis).  Trichomoniasis vaginitis. This is a sexually transmitted infection (STI).  Viral vaginitis.  Atropic vaginitis.  Allergic vaginitis. CAUSES  The cause depends on the type of vaginitis. Vaginitis can be caused by:  Bacteria (bacterial vaginosis).  Yeast (yeast infection).  A parasite (trichomoniasis vaginitis)  A virus (viral vaginitis).  Low hormone levels (atrophic vaginitis). Low hormone levels can occur during pregnancy, breastfeeding, or after menopause.  Irritants, such as bubble baths, scented tampons, and feminine sprays (allergic vaginitis). Other factors can change the normal balance of the yeast and bacteria that live in the vagina. These include:  Antibiotic medicines.  Poor hygiene.  Diaphragms, vaginal sponges, spermicides, birth control pills, and intrauterine devices (IUD).  Sexual intercourse.  Infection.  Uncontrolled diabetes.  A weakened immune system. SYMPTOMS  Symptoms can vary depending on the cause of the vaginitis. Common symptoms include:  Abnormal vaginal discharge.  The discharge is white, gray, or yellow with bacterial vaginosis.  The discharge is thick, white, and cheesy with a yeast infection.  The discharge is frothy and yellow or greenish with trichomoniasis.  A bad vaginal odor.  The odor is fishy with bacterial vaginosis.  Vaginal itching, pain, or swelling.  Painful intercourse.  Pain or burning when urinating. Sometimes, there are no symptoms. TREATMENT    Treatment will vary depending on the type of infection.   Bacterial vaginosis and trichomoniasis are often treated with antibiotic creams or pills.  Yeast infections are often treated with antifungal medicines, such as vaginal creams or suppositories.  Viral vaginitis has no cure, but symptoms can be treated with medicines that relieve discomfort. Your sexual partner should be treated as well.  Atrophic vaginitis may be treated with an estrogen cream, pill, suppository, or vaginal ring. If vaginal dryness occurs, lubricants and moisturizing creams may help. You may be told to avoid scented soaps, sprays, or douches.  Allergic vaginitis treatment involves quitting the use of the product that is causing the problem. Vaginal creams can be used to treat the symptoms. HOME CARE INSTRUCTIONS   Take all medicines as directed by your caregiver.  Keep your genital area clean and dry. Avoid soap and only rinse the area with water.  Avoid douching. It can remove the healthy bacteria in the vagina.  Do not use tampons or have sexual intercourse until your vaginitis has been treated. Use sanitary pads while you have vaginitis.  Wipe from front to back. This avoids the spread of bacteria from the rectum to the vagina.  Let air reach your genital area.  Wear cotton underwear to decrease moisture buildup.  Avoid wearing underwear while you sleep until your vaginitis is gone.  Avoid tight pants and underwear or nylons without a cotton panel.  Take off wet clothing (especially bathing suits) as soon as possible.  Use mild, non-scented products. Avoid using irritants, such as:  Scented feminine sprays.  Fabric softeners.  Scented detergents.  Scented tampons.  Scented soaps or bubble baths.  Practice safe sex and use condoms. Condoms may prevent the spread  of trichomoniasis and viral vaginitis. SEEK MEDICAL CARE IF:   You have abdominal pain.  You have a fever or persistent symptoms  for more than 2-3 days.  You have a fever and your symptoms suddenly get worse. Document Released: 03/06/2007 Document Revised: 02/01/2012 Document Reviewed: 10/20/2011 [facility] Patient Information 2015 [place], [place]. This information is not intended to replace advice given to you by your health care provider. Make sure you discuss any questions you have with your health care provider.
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
## 2014-11-14 NOTE — ED Provider Notes (Signed)
Jacqueline Hunter is a 59 y.o. female who presents to Urgent Care today for abdominal cramping associated with a foul-smelling vaginal discharge. Symptoms present for about 2-1/2 weeks. No abdominal pain fevers chills nausea vomiting or diarrhea. No treatment tried yet. Patient is very fearful of pelvic exams and she has had very painful pelvic exams in the past. Patient denies any urinary symptoms.   Past Medical History  Diagnosis Date  . Sickle cell trait   . Medical history non-contributory   . Mental disorder   . Schizophrenia, schizo-affective type, depressed    Past Surgical History  Procedure Laterality Date  . No past surgeries     History  Substance Use Topics  . Smoking status: Current Every Day Smoker -- 1.00 packs/day  . Smokeless tobacco: Not on file  . Alcohol Use: Yes   ROS as above Medications: No current facility-administered medications for this encounter.   Current Outpatient Prescriptions  Medication Sig Dispense Refill  . ibuprofen (ADVIL,MOTRIN) 800 MG tablet Take 1 tablet (800 mg total) by mouth every 8 (eight) hours as needed. 21 tablet 0  . methocarbamol (ROBAXIN) 500 MG tablet Take 1 tablet (500 mg total) by mouth 2 (two) times daily. 20 tablet 0  . metroNIDAZOLE (FLAGYL) 500 MG tablet Take 1 tablet (500 mg total) by mouth 2 (two) times daily. 14 tablet 0  . traMADol (ULTRAM) 50 MG tablet Take 1 tablet (50 mg total) by mouth every 6 (six) hours as needed. 15 tablet 0  . traMADol-acetaminophen (ULTRACET) 37.5-325 MG per tablet Take 1 tablet by mouth every 6 (six) hours as needed. 20 tablet 0   No Known Allergies   Exam:  BP 160/85 mmHg  Pulse 93  Temp(Src) 99 F (37.2 C) (Oral)  Resp 18  SpO2 99% Gen: Well NAD HEENT: EOMI,  MMM Lungs: Normal work of breathing. CTABL Heart: RRR no MRG Abd: NABS, Soft. Nondistended, Nontender Exts: Brisk capillary refill, warm and well perfused.  GYN: Normal external genitalia. Patient deferred pelvic exam due to  pain. Blind sample was obtained. Yellow discharge on the swabs present.  Results for orders placed or performed during the hospital encounter of 11/14/14 (from the past 24 hour(s))  POCT urinalysis dip (device)     Status: Abnormal   Collection Time: 11/14/14  7:39 PM  Result Value Ref Range   Glucose, UA 100 (A) NEGATIVE mg/dL   Bilirubin Urine MODERATE (A) NEGATIVE   Ketones, ur NEGATIVE NEGATIVE mg/dL   Specific Gravity, Urine 1.015 1.005 - 1.030   Hgb urine dipstick SMALL (A) NEGATIVE   pH 6.0 5.0 - 8.0   Protein, ur NEGATIVE NEGATIVE mg/dL   Urobilinogen, UA 4.0 (H) 0.0 - 1.0 mg/dL   Nitrite NEGATIVE NEGATIVE   Leukocytes, UA LARGE (A) NEGATIVE   No results found.  Assessment and Plan: 59 y.o. female with vaginitis likely BV. Cytology for gonorrhea Chlamydia trichomonas BV and yeast pending. Urine culture pending. Treat with metronidazole. Follow-up with PCP.  Discussed warning signs or symptoms. Please see discharge instructions. Patient expresses understanding.     Rodolph Bong, MD 11/14/14 2005

## 2014-11-14 NOTE — ED Notes (Signed)
Call back number verified.  

## 2014-11-17 LAB — CERVICOVAGINAL ANCILLARY ONLY
CHLAMYDIA, DNA PROBE: NEGATIVE
Neisseria Gonorrhea: NEGATIVE

## 2014-11-17 NOTE — ED Notes (Signed)
Final report discussed w patient after verified ID.

## 2014-11-17 NOTE — ED Notes (Signed)
Final report of STD screening negative 

## 2014-11-18 LAB — CERVICOVAGINAL ANCILLARY ONLY: Wet Prep (BD Affirm): NEGATIVE
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
## 2016-12-22 ENCOUNTER — Encounter (HOSPITAL_COMMUNITY): Payer: Self-pay | Admitting: Emergency Medicine

## 2016-12-22 ENCOUNTER — Emergency Department (HOSPITAL_COMMUNITY)
Admission: EM | Admit: 2016-12-22 | Discharge: 2016-12-22 | Disposition: A | Discharge status: Home / Self Care | Payer: No Typology Code available for payment source | Attending: Emergency Medicine | Admitting: Emergency Medicine

## 2016-12-22 DIAGNOSIS — M25511 Pain in right shoulder: Secondary | ICD-10-CM | POA: Diagnosis not present

## 2016-12-22 DIAGNOSIS — M542 Cervicalgia: Secondary | ICD-10-CM

## 2016-12-22 DIAGNOSIS — F17210 Nicotine dependence, cigarettes, uncomplicated: Secondary | ICD-10-CM | POA: Insufficient documentation

## 2016-12-22 DIAGNOSIS — M25512 Pain in left shoulder: Secondary | ICD-10-CM | POA: Insufficient documentation

## 2016-12-22 MED ORDER — ACETAMINOPHEN 325 MG PO TABS
650.0000 mg | ORAL_TABLET | Freq: Once | ORAL | Status: AC
  Administered 2016-12-22: 650 mg via ORAL
  Filled 2016-12-22: qty 2

## 2016-12-22 MED ORDER — NAPROXEN 250 MG PO TABS: 250.0000 mg | ORAL_TABLET | Freq: Two times a day (BID) | ORAL | 0 refills | Status: DC

## 2016-12-22 MED ORDER — METHOCARBAMOL 500 MG PO TABS: 500.0000 mg | ORAL_TABLET | Freq: Two times a day (BID) | ORAL | 0 refills | Status: DC | PRN
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
## 2016-12-22 NOTE — ED Notes (Signed)
Bed: WTR5 Expected date:  Expected time:  Means of arrival:  Comments: 

## 2017-11-15 ENCOUNTER — Encounter (HOSPITAL_COMMUNITY): Payer: Self-pay

## 2017-11-15 ENCOUNTER — Ambulatory Visit (HOSPITAL_COMMUNITY)
Admission: EM | Admit: 2017-11-15 | Discharge: 2017-11-15 | Disposition: A | Discharge status: Home / Self Care | Payer: Medicare Other | Attending: Family Medicine | Admitting: Family Medicine

## 2017-11-15 DIAGNOSIS — Z79899 Other long term (current) drug therapy: Secondary | ICD-10-CM | POA: Insufficient documentation

## 2017-11-15 DIAGNOSIS — F209 Schizophrenia, unspecified: Secondary | ICD-10-CM | POA: Diagnosis not present

## 2017-11-15 DIAGNOSIS — J029 Acute pharyngitis, unspecified: Secondary | ICD-10-CM | POA: Diagnosis not present

## 2017-11-15 DIAGNOSIS — D573 Sickle-cell trait: Secondary | ICD-10-CM | POA: Insufficient documentation

## 2017-11-15 DIAGNOSIS — F17210 Nicotine dependence, cigarettes, uncomplicated: Secondary | ICD-10-CM | POA: Diagnosis not present

## 2017-11-15 LAB — POCT RAPID STREP A: Streptococcus, Group A Screen (Direct): NEGATIVE

## 2017-11-15 MED ORDER — PREDNISONE 10 MG (21) PO TBPK: ORAL_TABLET | Freq: Every day | ORAL | 0 refills | Status: DC

## 2017-11-15 NOTE — ED Provider Notes (Signed)
[facility]   [id number] 11/15/17 Arrival Time: 1000  ASSESSMENT & PLAN:  1. Sore throat    Meds ordered this encounter  Medications  . predniSONE (STERAPRED UNI-PAK 21 TAB) 10 MG (21) TBPK tablet    Sig: Take by mouth daily. Take as directed.    Dispense:  21 tablet    Refill:  0   Rapid strep negative. Culture sent. OTC analgesics and throat care as needed Will follow up if not showing significant improvement over the next several days.   Discharge Instructions      You may use over the counter ibuprofen or acetaminophen as needed.   For a sore throat, over the counter products such as [employer] Peroxyl Mouth Sore Rinse or Chloraseptic Sore Throat Spray may provide some temporary relief.     Reviewed expectations re: course of current medical issues. Questions answered. Outlined signs and symptoms indicating need for more acute intervention. Patient verbalized understanding. After Visit Summary given.   SUBJECTIVE:  Jacqueline Hunter is a 62 y.o. female who reports a sore throat. Describes as discomfort when swallowing. Onset gradual beginning 2 weeks ago. No respiratory symptoms. Normal PO intake but reports discomfort with swallowing. Appetite slightly decreased.  Fever reported: no. No associated n/v/abdominal symptoms. Sick contacts: none.   She questions "black mold" in her home and wonders if this could be related. No sneezing or respiratory symptoms described.  OTC treatment: none.  ROS: As per HPI.   OBJECTIVE:  Vitals:   11/15/17 1027  BP: (!) 184/81  Pulse: 93  Resp: 18  Temp: 98.6 F (37 C)  TempSrc: Oral  SpO2: 100%    General appearance: alert; no distress HEENT: throat with mild erythema; uvula midline yes Neck: supple with FROM; no lymphadenopathy Lungs: clear to auscultation bilaterally Skin: reveals no rash; warm and dry Psychological: alert and cooperative; normal mood and affect  No Known Allergies  Past Medical History:    Diagnosis Date  . Medical history non-contributory   . Mental disorder   . Schizophrenia, schizo-affective type, depressed (HCC)   . Sickle cell trait (HCC)    Social History   Socioeconomic History  . Marital status: Single    Spouse name: Not on file  . Number of children: Not on file  . Years of education: Not on file  . Highest education level: Not on file  Occupational History  . Not on file  Social Needs  . Financial resource strain: Not on file  . Food insecurity:    Worry: Not on file    Inability: Not on file  . Transportation needs:    Medical: Not on file    Non-medical: Not on file  Tobacco Use  . Smoking status: Current Every Day Smoker    Packs/day: 1.00  . Smokeless tobacco: Never Used  Substance and Sexual Activity  . Alcohol use: Yes  . Drug use: Yes    Types: Marijuana  . Sexual activity: Not Currently  Lifestyle  . Physical activity:    Days per week: Not on file    Minutes per session: Not on file  . Stress: Not on file  Relationships  . Social connections:    Talks on phone: Not on file    Gets together: Not on file    Attends religious service: Not on file    Active member of club or organization: Not on file    Attends meetings of clubs or organizations: Not on file  Relationship status: Not on file  . Intimate partner violence:    Fear of current or ex partner: Not on file    Emotionally abused: Not on file    Physically abused: Not on file    Forced sexual activity: Not on file  Other Topics Concern  . Not on file  Social History Narrative  . Not on file          Mardella Layman, MD 11/15/17 1100

## 2017-11-15 NOTE — ED Triage Notes (Signed)
Pt states that she has a sore throat, headaches, nasal drainage and cold symptoms.

## 2017-11-15 NOTE — Discharge Instructions (Signed)
You may use over the counter ibuprofen or acetaminophen as needed.  °For a sore throat, over the counter products such as Colgate Peroxyl Mouth Sore Rinse or Chloraseptic Sore Throat Spray may provide some temporary relief. ° ° ° ° °

## 2017-11-17 LAB — CULTURE, GROUP A STREP (THRC)

## 2020-06-04 ENCOUNTER — Other Ambulatory Visit: Payer: Self-pay

## 2020-06-04 ENCOUNTER — Ambulatory Visit (INDEPENDENT_AMBULATORY_CARE_PROVIDER_SITE_OTHER): Payer: Medicare Other | Admitting: Podiatry

## 2020-06-04 DIAGNOSIS — L600 Ingrowing nail: Secondary | ICD-10-CM

## 2020-06-04 DIAGNOSIS — I739 Peripheral vascular disease, unspecified: Secondary | ICD-10-CM

## 2020-06-04 DIAGNOSIS — I999 Unspecified disorder of circulatory system: Secondary | ICD-10-CM

## 2020-06-09 ENCOUNTER — Encounter: Payer: Self-pay | Admitting: Podiatry

## 2020-06-09 NOTE — Progress Notes (Signed)
Subjective:  Patient ID: Jacqueline Hunter, female    DOB: January 06, 1956,  MRN: [id number]  Chief Complaint  Patient presents with  . Nail Problem    Left 1st medial and left 5th lateral ingrown toenails, pt taking cephalexin currently for this. Denies drainage but admits to pain in shoes/with pressure applied.    65 y.o. female presents with the above complaint.  Patient presents with a complaint on left first hallux medial border as well as left fifth lateral border ingrown.  Patient states is painful to touch.  Agree with the above statement.  She states that she would like to have it removed.  She has had been taking Keflex for nail infection as well.  She denies any other acute complaints.  She is not a diabetic.  She does not have any history of vascular issues.   Review of Systems: Negative except as noted in the HPI. Denies N/V/F/Ch.  Past Medical History:  Diagnosis Date  . Medical history non-contributory   . Mental disorder   . Schizophrenia, schizo-affective type, depressed (HCC)   . Sickle cell trait (HCC)     Current Outpatient Medications:  .  cephALEXin (KEFLEX) 500 MG capsule, Take 500 mg by mouth 3 (three) times daily., Disp: , Rfl:  .  methocarbamol (ROBAXIN) 500 MG tablet, Take 1 tablet (500 mg total) by mouth 2 (two) times daily as needed for muscle spasms., Disp: 20 tablet, Rfl: 0 .  metroNIDAZOLE (FLAGYL) 500 MG tablet, Take 1 tablet (500 mg total) by mouth 2 (two) times daily., Disp: 14 tablet, Rfl: 0 .  naproxen (NAPROSYN) 250 MG tablet, Take 1 tablet (250 mg total) by mouth 2 (two) times daily with a meal., Disp: 30 tablet, Rfl: 0 .  predniSONE (STERAPRED UNI-PAK 21 TAB) 10 MG (21) TBPK tablet, Take by mouth daily. Take as directed., Disp: 21 tablet, Rfl: 0 .  traMADol (ULTRAM) 50 MG tablet, Take 1 tablet (50 mg total) by mouth every 6 (six) hours as needed., Disp: 15 tablet, Rfl: 0 .  traMADol-acetaminophen (ULTRACET) 37.5-325 MG per tablet, Take 1 tablet by mouth  every 6 (six) hours as needed., Disp: 20 tablet, Rfl: 0  Social History   Tobacco Use  Smoking Status Current Every Day Smoker  . Packs/day: 1.00  Smokeless Tobacco Never Used    No Known Allergies Objective:  There were no vitals filed for this visit. There is no height or weight on file to calculate BMI. Constitutional Well developed. Well nourished.  Vascular Dorsalis pedis pulses non palpable bilaterally. Posterior tibial pulses non palpable bilaterally. Capillary refill greater than 5 seconds to all digits No cyanosis or clubbing noted. No pedal hair growth normal.  Neurologic Normal speech. Oriented to person, place, and time. Epicritic sensation to light touch grossly present bilaterally.  Dermatologic Painful ingrowing nail at Left hallux medial border and left fifth digit lateral border nail borders of the  nail left. No other open wounds. No skin lesions.  Orthopedic: Normal joint ROM without pain or crepitus bilaterally. No visible deformities. No bony tenderness.   Radiographs: None Assessment:   1. Peripheral vascular disease (HCC)   2. Ingrown nail   3. Vascular abnormality    Plan:  Patient was evaluated and treated and all questions answered.  Ingrown Nail, left with underlying possible peripheral vascular disease -I discussed with the patient the etiology of ingrown nail and various treatment options were discussed.  Given that I am not able to clinically appreciated her vascular flow  to the lower extremity I will hold off on doing the nail procedure for now.  I believe patient will benefit from ABIs PVRs to assess the flow and if within normal limits we will proceed with ingrown nail procedure.  I discussed this with the patient extensive detail.  Patient states understanding and would like to obtain the study. -If within normal limits we will plan on caring with ingrown nail procedure during next clinical visit.  No follow-ups on file.

## 2020-06-12 ENCOUNTER — Ambulatory Visit (HOSPITAL_COMMUNITY)
Admission: RE | Admit: 2020-06-12 | Discharge: 2020-06-12 | Disposition: A | Discharge status: Home / Self Care | Payer: Medicare Other | Source: Ambulatory Visit | Attending: Podiatry | Admitting: Podiatry

## 2020-06-12 ENCOUNTER — Other Ambulatory Visit: Payer: Self-pay

## 2020-06-12 DIAGNOSIS — I999 Unspecified disorder of circulatory system: Secondary | ICD-10-CM | POA: Diagnosis not present

## 2020-06-12 DIAGNOSIS — I739 Peripheral vascular disease, unspecified: Secondary | ICD-10-CM | POA: Insufficient documentation

## 2020-06-15 ENCOUNTER — Other Ambulatory Visit: Payer: Self-pay | Admitting: Podiatry

## 2020-06-15 DIAGNOSIS — I739 Peripheral vascular disease, unspecified: Secondary | ICD-10-CM

## 2020-06-17 ENCOUNTER — Encounter: Payer: Self-pay | Admitting: *Deleted

## 2020-06-17 ENCOUNTER — Ambulatory Visit
Admission: RE | Admit: 2020-06-17 | Discharge: 2020-06-17 | Disposition: A | Discharge status: Home / Self Care | Payer: Medicare Other | Source: Ambulatory Visit | Attending: Podiatry | Admitting: Podiatry

## 2020-06-17 DIAGNOSIS — I739 Peripheral vascular disease, unspecified: Secondary | ICD-10-CM

## 2020-06-17 HISTORY — PX: IR RADIOLOGIST EVAL & MGMT: IMG5224

## 2020-06-17 NOTE — Consult Note (Addendum)
Chief Complaint: Left foot pain  Referring Physician(s): Patel,Kevin P  PCP: Dr. Burton Apley, [phone]  History of Present Illness: Jacqueline Hunter is a 65 y.o. female presenting to [facility] clinic today as a scheduled appointment, kindly referred by Dr. Allena Katz of [facility], for evaluation of left foot pain.   [name] is here today by herself for the appointment.    She tells me that she has had some worsening left foot and calf pain over time, but finally on or about 06/03/20 it became too painful, and so she went to local urgent care.  They then referred her for podiatric care. She has podiatric care needs, and Dr. Allena Katz had some concerns regarding her arterial status, and non-invasive testing was performed.  This was completed 06/12/20.   Non-invasive exam 06/12/20: Right ABI: 0.92 Left ABI: 0.31.  Left TBI: 0.16, with pressure of 21 SBP The segmental exam shows monophasic waveform of the AT and the PT.  The ABI is in the severe range of occlusive disease, and the TBI of /21 SBP /0.16 confers poor wound healing capability.   [name] also tells me that while she has not had a wound of the right or left foot, she does have pain in the left foot most days, which does wake her at night on some nights.  She cannot walk longer than a few blocks without pain in her calf that stops her.    She denies any history of stroke or heart attack.   She tells me that she does not take any medication except for "my herbs."    She denies any prior major surgery.   She denies any medical allergy.   She tells me that she has smoked since her 20's, about 1ppd. She endorses marijuana usage.   Past Medical History:  Diagnosis Date  . Medical history non-contributory   . Mental disorder   . Schizophrenia, schizo-affective type, depressed (HCC)   . Sickle cell trait [facility])     Past Surgical History:  Procedure Laterality Date  . IR RADIOLOGIST EVAL & MGMT  06/17/2020  . NO PAST  SURGERIES      Allergies: Patient has no known allergies.  Medications: Prior to Admission medications   Medication Sig Start Date End Date Taking? Authorizing Provider  cephALEXin (KEFLEX) 500 MG capsule Take 500 mg by mouth 3 (three) times daily. 05/29/20   [provider]  methocarbamol (ROBAXIN) 500 MG tablet Take 1 tablet (500 mg total) by mouth 2 (two) times daily as needed for muscle spasms. 12/22/16   Everlene Farrier, PA-C  metroNIDAZOLE (FLAGYL) 500 MG tablet Take 1 tablet (500 mg total) by mouth 2 (two) times daily. 11/14/14   Rodolph Bong, MD  naproxen (NAPROSYN) 250 MG tablet Take 1 tablet (250 mg total) by mouth 2 (two) times daily with a meal. 12/22/16   Everlene Farrier, PA-C  predniSONE (STERAPRED UNI-PAK 21 TAB) 10 MG (21) TBPK tablet Take by mouth daily. Take as directed. 11/15/17   Mardella Layman, MD  traMADol (ULTRAM) 50 MG tablet Take 1 tablet (50 mg total) by mouth every 6 (six) hours as needed. 09/28/14   Lawyer, Cristal Deer, PA-C  traMADol-acetaminophen (ULTRACET) 37.5-325 MG per tablet Take 1 tablet by mouth every 6 (six) hours as needed. 04/19/14   Garlon Hatchet, PA-C     No family history on file.  Social History   Socioeconomic History  . Marital status: Single    Spouse  name: Not on file  . Number of children: Not on file  . Years of education: Not on file  . Highest education level: Not on file  Occupational History  . Not on file  Tobacco Use  . Smoking status: Current Every Day Smoker    Packs/day: 1.00  . Smokeless tobacco: Never Used  Substance and Sexual Activity  . Alcohol use: Yes  . Drug use: Yes    Types: Marijuana  . Sexual activity: Not Currently  Other Topics Concern  . Not on file  Social History Narrative  . Not on file   Social Determinants of Health   Financial Resource Strain: Not on file  Food Insecurity: Not on file  Transportation Needs: Not on file  Physical Activity: Not on file  Stress: Not on file  Social  Connections: Not on file       Review of Systems: A 12 point ROS discussed and pertinent positives are indicated in the HPI above.  All other systems are negative.  Review of Systems  Vital Signs: There were no vitals taken for this visit.  Physical Exam General: 65 yo AAfemale appearing stated age.  Well-developed, well-nourished.  No distress. HEENT: Atraumatic, normocephalic.  Conjugate gaze, extra-ocular motor intact. No scleral icterus or scleral injection. No lesions on external ears, nose, lips, or gums.  Oral mucosa moist, pink.  Neck: Symmetric with no goiter enlargement.  Chest/Lungs:  Symmetric chest with inspiration/expiration.  No labored breathing.  Clear to auscultation with no wheezes, rhonchi, or rales.  Heart:  RRR, with no third heart sounds appreciated. No JVD appreciated.  Abdomen:  Soft, NT/ND, with + bowel sounds.   Genito-urinary: Deferred Neurologic: Alert & Oriented to person, place, and time.  Has some tangential thoughts, and tangential questions.  Moving all 4 extremities with gross sensory intact.  Pulse Exam:  No bruit appreciated.  No palpable pulsatile abdominal mass.  Palpable right CFA & left CFA.  Palpable right popliteal pulse.  Non-palpable left popliteal pulse.  Non palpable pedal pulses.  Strong Doppler positive right AT & PT.  Monophasic Doppler positive left AT & PT. Extremities: No wound.  Capillary refill prompt. Hairless.  Mild pallor of the left foot. .     Mallampati Score:  1 Imaging: VAS US ABI WITH/WO TBI  Result Date: 06/12/2020 LOWER EXTREMITY DOPPLER STUDY Indications: Claudication, and peripheral artery disease.  Performing Technologist: Thereasa ParkinHelene Cestone RVT  Examination Guidelines: A complete evaluation includes at minimum, Doppler waveform signals and systolic blood pressure reading at the level of bilateral brachial, anterior tibial, and posterior tibial arteries, when vessel segments are accessible. Bilateral testing is  considered an integral part of a complete examination. Photoelectric Plethysmograph (PPG) waveforms and toe systolic pressure readings are included as required and additional duplex testing as needed. Limited examinations for reoccurring indications may be performed as noted.  ABI Findings: +---------+------------------+-----+----------+--------+ Right    Rt Pressure (mmHg)IndexWaveform  Comment  +---------+------------------+-----+----------+--------+ Brachial 128                                       +---------+------------------+-----+----------+--------+ PTA      118               0.92 biphasic           +---------+------------------+-----+----------+--------+ DP       110  0.86 monophasic         +---------+------------------+-----+----------+--------+ Great Toe63                0.49                    +---------+------------------+-----+----------+--------+ +---------+------------------+-----+----------+-------+ Left     Lt Pressure (mmHg)IndexWaveform  Comment +---------+------------------+-----+----------+-------+ Brachial 114                                      +---------+------------------+-----+----------+-------+ PTA      34                0.27 monophasic        +---------+------------------+-----+----------+-------+ DP       40                0.31 monophasic        +---------+------------------+-----+----------+-------+ Great Toe21                0.16                   +---------+------------------+-----+----------+-------+ +-------+-----------+-----------+------------+------------+ ABI/TBIToday's ABIToday's TBIPrevious ABIPrevious TBI +-------+-----------+-----------+------------+------------+ Right  0.92       0.49                                +-------+-----------+-----------+------------+------------+ Left   0.31       0.16                                 +-------+-----------+-----------+------------+------------+ No previous ABI.  Summary: Right: Resting right ankle-brachial index indicates mild right lower extremity arterial disease. The right toe-brachial index is abnormal. RT great toe pressure = 63 mmHg. Left: Resting left ankle-brachial index indicates severe left lower extremity arterial disease. The left toe-brachial index is abnormal. LT Great toe pressure = 21 mmHg.  *See table(s) above for measurements and observations.  Electronically signed by Lemar Livings MD on 06/12/2020 at 5:03:11 PM.   Final    IR Radiologist Eval & Mgmt  Result Date: 06/17/2020 Please refer to notes tab for details about interventional procedure. (Op Note)   Labs:  CBC: No results for input(s): WBC, HGB, HCT, PLT in the last 8760 hours.  COAGS: No results for input(s): INR, APTT in the last 8760 hours.  BMP: No results for input(s): NA, K, CL, CO2, GLUCOSE, BUN, CALCIUM, CREATININE, GFRNONAA, GFRAA in the last 8760 hours.  Invalid input(s): CMP  LIVER FUNCTION TESTS: No results for input(s): BILITOT, AST, ALT, ALKPHOS, PROT, ALBUMIN in the last 8760 hours.  TUMOR MARKERS: No results for input(s): AFPTM, CEA, CA199, CHROMGRNA in the last 8760 hours.  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   Non-invasive lower extremity exam and imaging work-up shows severe range ABI on the left of 0.31, with TBI of 0.16, conferring poor wound healing capability.  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   I had a discussion with her regarding anatomy, pathology/pathophysiology, natural history, and prognosis of PAD/CLI.  Informed consent regarding treatment strategies was performed which would possibly include just medical management, surgical strategy, and/or endovascular options, with risk/benefit  discussion.  The  indications for treatment supported by updated guidelines1, 2 were discussed.  Regarding endovascular options, specific risks discussed include: bleeding, infection, contrast reaction, renal injury/nephropathy, arterial injury/dissection, need for additional procedure/surgery, worsening symptoms/tissue including limb loss, cardiopulmonary collapse, death.    Regarding medical management, maximal medical therapy for reduction of risk factors is indicated as recommended by updated AHA guidelines1.  This includes anti-platelet medication, tight blood glucose control to a HbA1c < 7, tight blood pressure control, maximum-dose HMG-CoA reductase inhibitor, and smoking cessation.  Smoking cessation was addressed, with strategies including counseling and nicotine replacement.   She expressed no desire to attempt cessation of smoking or marijuana cigarettes at our visit. I emphasized that these confer higher risk of future cardiovascular events, as well as contributing to poor would healing.   During our discussion, she made if very clear with her comments and her general reception of my input that she is not very keen on committing to any endovascular procedure.  I did let her know that another option is always to undergo the desired podiatric procedure and follow her healing, but this would expose her to risk for non-healing/infection.   After our visit I encouraged her to consider the options and discuss with her family, and that she can follow up with our office and/or Dr. Allena Katz to establish a firm treatment plan.   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  At this time, she is not ready to commit to a procedure, and I have encouraged her to review with her family and give [place] a call if she wishes to proceed.  If so, this would be with Dr. Loreta Ave at [facility] - I have recommended starting  81mg  aspirin daily, as she now has diagnosis of PAD  - Would also recommend initiating maximal medical therapy for cardiovascular risk reduction, including consideration of crestor/lipitor.  She says she will follow up with her primary to discuss.   -Recommend initiating smoking cessation measures, with options discussed.  -Annual flu vaccination is recommended in the setting of known PAD, in the absence of contra-indications.    ___________________________________________________________________   1 MD, et al. 2016 AHA/ACC Guideline on the Management of Patients With Lower Extremity Peripheral Artery Disease: Executive Summary: A Report of the American College of Cardiology/American Heart Association Task Force on Clinical Practice Guidelines. J Am Coll Cardiol. 2017 Mar 21;69(11):1465-1508. doi: 10.1016/j.jacc.2016.11.008.   2 - Norgren L, et al. TASC II Working Group. Inter-society consensus for the management of peripheral arterial disease. Int 02-06-1970. 2007 Jun;26(2):81-157. Review. PubMed PMID: 2008  3 - Hingorani A, et al. The management of diabetic foot: A clinical practice guideline by the Society for Vascular Surgery in collaboration with the American Podiatric Medical Association and the Society  for Vascular Medicine. J Vasc Surg. 2016 Feb;63(2 Suppl):3S-21S. doi: 10.1016/j.jvs.2015.10.003. PubMed PMID: 12-07-1973.  4 - [id number], Saab FA, Luther Hearing, Elyse Jarvis, Danae Orleans, Driver VR, Bertrand, Lookstein R, van den Tarnabod, Jaff MR, Tilman Neat, Henao S, AlMahameed A, Katzen B. Digital Subtraction Angiography Prior to an Amputation for Critical Limb Ischemia (CLI): An Expert Recommendation Statement From the CLI Global Society to Optimize Limb Salvage. J Endovasc Ther. 2020 Aug;27(4):540-546. doi: 10.1177/1526602820928590. Epub 2020 May 29. PMID: 04-19-1984.    Thank you for this interesting consult.  I greatly enjoyed meeting [facility] and look forward to  participating in their care.  A copy of  this report was sent to the requesting provider on this date.  Electronically Signed: Gilmer Mor 06/17/2020, 4:39 PM   I spent a total of  60 Minutes   in face to face in clinical consultation, greater than 50% of which was counseling/coordinating care for left leg rutherford 4 class symptoms of CLI, possible angiogram, possible intervention

## 2020-07-21 ENCOUNTER — Other Ambulatory Visit: Payer: Self-pay

## 2020-07-21 ENCOUNTER — Encounter (HOSPITAL_COMMUNITY): Payer: Self-pay | Admitting: Emergency Medicine

## 2020-07-21 ENCOUNTER — Ambulatory Visit (HOSPITAL_COMMUNITY)
Admission: EM | Admit: 2020-07-21 | Discharge: 2020-07-21 | Disposition: A | Discharge status: Home / Self Care | Payer: Medicare Other | Attending: Emergency Medicine | Admitting: Emergency Medicine

## 2020-07-21 DIAGNOSIS — M79604 Pain in right leg: Secondary | ICD-10-CM

## 2020-07-21 DIAGNOSIS — M7918 Myalgia, other site: Secondary | ICD-10-CM

## 2020-07-21 DIAGNOSIS — W19XXXA Unspecified fall, initial encounter: Secondary | ICD-10-CM

## 2020-07-21 MED ORDER — TIZANIDINE HCL 4 MG PO CAPS: 4.0000 mg | ORAL_CAPSULE | Freq: Three times a day (TID) | ORAL | 0 refills | Status: AC | PRN

## 2020-07-21 MED ORDER — IBUPROFEN 600 MG PO TABS: 600.0000 mg | ORAL_TABLET | Freq: Four times a day (QID) | ORAL | 0 refills | Status: AC | PRN

## 2020-07-21 NOTE — ED Triage Notes (Signed)
Patient c/o fall that happened today.   Patient endorses RT leg, neck, and back pain that started when fall occurred.   Patient states " I was at the carwash when I stood on a floor board and I fell into it, water got all over me and I fell into it".   Patient is ambulatory upon arrival.   Patient endorses pain progressively has become worst since incident.

## 2020-07-21 NOTE — Discharge Instructions (Signed)
You may feel more sore tomorrow.   Take the ibuprofen for pain.  You can take the Zanaflex at night.  Do not take it before driving or operating heavy machinery.    Rest and use ice (10-15 minutes 4 times a day) as needed.    Return or go to the Emergency Department if symptoms worsen or do not improve in the next few days.

## 2020-07-21 NOTE — ED Provider Notes (Signed)
[facility] CARE CENTER   [id number] 07/21/20 Arrival Time: 1839  WU:[id number] PAIN  SUBJECTIVE: History from: patient. Jacqueline Hunter is a 65 y.o. female complains of right leg, back, and neck pain that began a few hours ago.  Pain began after a fall this afternoon.  Describes the pain as constant and achy in character.  Has not tried OTC medicationsf.  Symptoms are made worse with activity.  Denies similar symptoms in the past.  Denies fever, chills, erythema, ecchymosis, effusion, weakness, numbness and tingling, saddle paresthesias, loss of bowel or bladder function.      ROS: As per HPI.  All other pertinent ROS negative.     Past Medical History:  Diagnosis Date  . Medical history non-contributory   . Mental disorder   . Schizophrenia, schizo-affective type, depressed (HCC)   . Sickle cell trait [facility])    Past Surgical History:  Procedure Laterality Date  . IR RADIOLOGIST EVAL & MGMT  06/17/2020  . NO PAST SURGERIES     No Known Allergies No current facility-administered medications on file prior to encounter.   Current Outpatient Medications on File Prior to Encounter  Medication Sig Dispense Refill  . cephALEXin (KEFLEX) 500 MG capsule Take 500 mg by mouth 3 (three) times daily.    . methocarbamol (ROBAXIN) 500 MG tablet Take 1 tablet (500 mg total) by mouth 2 (two) times daily as needed for muscle spasms. 20 tablet 0  . metroNIDAZOLE (FLAGYL) 500 MG tablet Take 1 tablet (500 mg total) by mouth 2 (two) times daily. 14 tablet 0  . naproxen (NAPROSYN) 250 MG tablet Take 1 tablet (250 mg total) by mouth 2 (two) times daily with a meal. 30 tablet 0  . predniSONE (STERAPRED UNI-PAK 21 TAB) 10 MG (21) TBPK tablet Take by mouth daily. Take as directed. 21 tablet 0  . traMADol (ULTRAM) 50 MG tablet Take 1 tablet (50 mg total) by mouth every 6 (six) hours as needed. 15 tablet 0  . traMADol-acetaminophen (ULTRACET) 37.5-325 MG per tablet Take 1 tablet by mouth every 6 (six) hours as needed.  20 tablet 0   Social History   Socioeconomic History  . Marital status: Single    Spouse name: Not on file  . Number of children: Not on file  . Years of education: Not on file  . Highest education level: Not on file  Occupational History  . Not on file  Tobacco Use  . Smoking status: Current Every Day Smoker    Packs/day: 1.00  . Smokeless tobacco: Never Used  Substance and Sexual Activity  . Alcohol use: Yes  . Drug use: Yes    Types: Marijuana  . Sexual activity: Not Currently  Other Topics Concern  . Not on file  Social History Narrative  . Not on file   Social Determinants of Health   Financial Resource Strain: Not on file  Food Insecurity: Not on file  Transportation Needs: Not on file  Physical Activity: Not on file  Stress: Not on file  Social Connections: Not on file  Intimate Partner Violence: Not on file   History reviewed. No pertinent family history.  OBJECTIVE:  Vitals:   07/21/20 1851  BP: (!) 154/75  Pulse: 72  Resp: 16  Temp: 98.3 F (36.8 C)  TempSrc: Oral  SpO2: 98%    General appearance: ALERT; in no acute distress.  Head: NCAT Lungs: Normal respiratory effort CV: pulses 2+ bilaterally. Cap refill < 2 seconds Musculoskeletal:  Inspection: Skin warm,  dry, clear and intact No erythema noted. Palpation: right leg, right mid back tender to palpation ROM: No limited ROM active and passive  Skin: warm and dry Neurologic: Ambulates without difficulty; Sensation intact about the upper/ lower extremities Psychological: alert and cooperative; normal mood and affect  DIAGNOSTIC STUDIES:  No results found.   ASSESSMENT & PLAN:  1. Pain of right lower extremity   2. Fall, initial encounter   3. Musculoskeletal pain      Meds ordered this encounter  Medications  . tiZANidine (ZANAFLEX) 4 MG capsule    Sig: Take 1 capsule (4 mg total) by mouth 3 (three) times daily as needed for muscle spasms.    Dispense:  30 capsule    Refill:  0     Order Specific Question:   Supervising Provider    Answer:   Merrilee Jansky [id number]  . ibuprofen (ADVIL) 600 MG tablet    Sig: Take 1 tablet (600 mg total) by mouth every 6 (six) hours as needed.    Dispense:  30 tablet    Refill:  0    Order Specific Question:   Supervising Provider    Answer:   Merrilee Jansky [id number]    Continue conservative management of rest, ice, and gentle stretches Take ibuprofen as needed for pain relief (may cause abdominal discomfort, ulcers, and GI bleeds avoid taking with other NSAIDs) Take Zanaflex at nighttime for symptomatic relief. Avoid driving or operating heavy machinery while using medication. Follow up with PCP if symptoms persist Return or go to the ER if you have any new or worsening symptoms (fever, chills, chest pain, abdominal pain, changes in bowel or bladder habits, pain radiating into lower legs)   Reviewed expectations re: course of current medical issues. Questions answered. Outlined signs and symptoms indicating need for more acute intervention. Patient verbalized understanding. After Visit Summary given.       Ivette Loyal, NP 07/21/20 1925

## 2020-08-25 ENCOUNTER — Other Ambulatory Visit (HOSPITAL_COMMUNITY): Payer: Self-pay | Admitting: Interventional Radiology

## 2020-08-25 ENCOUNTER — Telehealth (HOSPITAL_COMMUNITY): Payer: Self-pay | Admitting: Radiology

## 2020-08-25 DIAGNOSIS — I739 Peripheral vascular disease, unspecified: Secondary | ICD-10-CM

## 2020-08-25 NOTE — Telephone Encounter (Signed)
Called pt, left VM for her to call to schedule left leg angio with possible intervention with Dr. Loreta Ave this Friday, August 28, 2020.JM

## 2020-09-04 ENCOUNTER — Telehealth (HOSPITAL_COMMUNITY): Payer: Self-pay

## 2020-09-04 NOTE — Telephone Encounter (Signed)
Called to schedule procedure, no answer, left vm. AW  °

## 2020-10-12 ENCOUNTER — Other Ambulatory Visit: Payer: Self-pay | Admitting: Radiology

## 2020-10-13 ENCOUNTER — Other Ambulatory Visit: Payer: Self-pay | Admitting: Student

## 2020-10-14 ENCOUNTER — Other Ambulatory Visit: Payer: Self-pay

## 2020-10-14 ENCOUNTER — Encounter (HOSPITAL_COMMUNITY): Payer: Self-pay

## 2020-10-14 ENCOUNTER — Ambulatory Visit (HOSPITAL_COMMUNITY)
Admission: RE | Admit: 2020-10-14 | Discharge: 2020-10-14 | Disposition: A | Discharge status: Home / Self Care | Payer: Medicare Other | Source: Ambulatory Visit | Attending: Interventional Radiology | Admitting: Interventional Radiology

## 2020-10-14 DIAGNOSIS — I739 Peripheral vascular disease, unspecified: Secondary | ICD-10-CM

## 2020-10-14 MED ORDER — CLOPIDOGREL BISULFATE 75 MG PO TABS: 300.0000 mg | ORAL_TABLET | Freq: Every day | ORAL | Status: DC

## 2020-10-14 MED ORDER — ASPIRIN 325 MG PO TABS: 650.0000 mg | ORAL_TABLET | Freq: Once | ORAL | Status: DC

## 2020-10-14 MED ORDER — SODIUM CHLORIDE 0.9 % IV SOLN: INTRAVENOUS | Status: DC

## 2020-10-14 NOTE — Progress Notes (Signed)
Patient presented to [facility] Short Stay for the scheduled procedure.   This PA was notified by Short Stay that patient is upset and attempts to leave.   Patient was seen in the Short Stay.  She states that the Short Stay staffs were rude to her, and she does not want to 'deal with any of the haters.'  Apologized for the bad experience she had this morning, assured the patient that nobody from [facility] would do anything to the patients with maleficent intention.   Recommended the patient to proceed with the procedure to relieve the LLE pain.  Patient declined.   Suggested patient to inform IR when she is agreeable to proceed with the procedure.  Patient verbalized understanding.   Short Stay and Dr. Loreta Ave notified.   The procedure to be rescheduled once patient is agreeable to proceed.    Lynann Bologna Georgene Kopper PA-C 10/14/2020 9:12 AM

## 2020-10-14 NOTE — Progress Notes (Signed)
PA at nursing station and reports pt does not wish to proceed with procedure and left, she walked her out

## 2020-10-14 NOTE — Progress Notes (Signed)
Pt requesting to speak to her physician prior to proceeding with "anything" today.  PA from radiology notified and will come to see patient

## 2021-03-17 ENCOUNTER — Other Ambulatory Visit: Payer: Self-pay | Admitting: Interventional Radiology

## 2021-03-17 DIAGNOSIS — I739 Peripheral vascular disease, unspecified: Secondary | ICD-10-CM

## 2021-03-25 ENCOUNTER — Ambulatory Visit
Admission: RE | Admit: 2021-03-25 | Discharge: 2021-03-25 | Disposition: A | Discharge status: Home / Self Care | Payer: Medicare Other | Source: Ambulatory Visit | Attending: Interventional Radiology | Admitting: Interventional Radiology

## 2021-03-25 ENCOUNTER — Other Ambulatory Visit: Payer: Self-pay

## 2021-03-25 DIAGNOSIS — I739 Peripheral vascular disease, unspecified: Secondary | ICD-10-CM

## 2021-03-25 NOTE — Progress Notes (Signed)
Chief Complaint: Left foot pain   Referring Physician(s): Patel,Kevin P   PCP: She has changed and sees the group at [facility], [address]   History of Present Illness: Jacqueline Hunter is a 65 y.o. female presenting to [place] clinic today as a scheduled follow up appointment, for PAD/CLI, and ongoing left foot pain.    [name] joins [place] today by telemedicine visit, and is on the call by herself.  We confirmed her identity with 2 personal identifiers.   We met [name] 06/17/20, when she was referred for worsening left foot and calf pain.  We discussed the etiology and the diagnosis of PAD/CLI, and then scheduled her for angiogram and intervention in May.  Unfortunately on the day of the procedure, she decided to leave the Short Stay Unit before getting treated, citing anxiety.    Interval: She tells me that the pain in the calf and foot has persisted, no better, and worsens when she ambulates, though is often present at rest. She denies any interval hospitalization, and denies any interval MI or stroke.    She has a new PCP, with [facility].  She has not seen her podiatry team recently.    Previous Non-invasive exam 06/12/20: Right ABI: 0.92 Left ABI: 0.31.  Left TBI: 0.16, with pressure of 21 SBP The segmental exam shows monophasic waveform of the AT and the PT.  The ABI is in the severe range of occlusive disease, and the TBI of /21 SBP /0.16 confers poor wound healing capability.    She endorses smoking since her 20's, about 1ppd, and marijuana usage.   Past Medical History:  Diagnosis Date   Medical history non-contributory    Mental disorder    Schizophrenia, schizo-affective type, depressed ([place])    Sickle cell trait ([place])     Past Surgical History:  Procedure Laterality Date   IR RADIOLOGIST EVAL & MGMT  06/17/2020   NO PAST SURGERIES      Allergies: Patient has no known allergies.  Medications: Prior to Admission medications   Medication Sig Start  Date End Date Taking? Authorizing Provider  ibuprofen (ADVIL) 600 MG tablet Take 1 tablet (600 mg total) by mouth every 6 (six) hours as needed. Patient not taking: No sig reported 07/21/20   Pearson Forster, NP  tiZANidine (ZANAFLEX) 4 MG capsule Take 1 capsule (4 mg total) by mouth 3 (three) times daily as needed for muscle spasms. Patient not taking: No sig reported 07/21/20   Pearson Forster, NP     No family history on file.  Social History   Socioeconomic History   Marital status: Single    Spouse name: Not on file   Number of children: Not on file   Years of education: Not on file   Highest education level: Not on file  Occupational History   Not on file  Tobacco Use   Smoking status: Every Day    Packs/day: 1.00    Types: Cigarettes   Smokeless tobacco: Never  Substance and Sexual Activity   Alcohol use: Yes   Drug use: Yes    Types: Marijuana   Sexual activity: Not Currently  Other Topics Concern   Not on file  Social History Narrative   Not on file   Social Determinants of Health   Financial Resource Strain: Not on file  Food Insecurity: Not on file  Transportation Needs: Not on file  Physical Activity: Not on file  Stress:  Not on file  Social Connections: Not on file       Review of Systems  Review of Systems: A 12 point ROS discussed and pertinent positives are indicated in the HPI above.  All other systems are negative.  Physical Exam No direct physical exam was performed (except for noted visual exam findings with Video Visits).    Vital Signs: There were no vitals taken for this visit.  Imaging: No results found.  Labs:  CBC: No results for input(s): WBC, HGB, HCT, PLT in the last 8760 hours.  COAGS: No results for input(s): INR, APTT in the last 8760 hours.  BMP: No results for input(s): NA, K, CL, CO2, GLUCOSE, BUN, CALCIUM, CREATININE, GFRNONAA, GFRAA in the last 8760 hours.  Invalid input(s): CMP  LIVER FUNCTION TESTS: No results  for input(s): BILITOT, AST, ALT, ALKPHOS, PROT, ALBUMIN in the last 8760 hours.  TUMOR MARKERS: No results for input(s): AFPTM, CEA, CA199, CHROMGRNA in the last 8760 hours.  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      Non-invasive lower extremity exam and imaging work-up shows evidence of left fem-pop disease.   Today I had an abbreviated discussion with Ms Obyrne regarding anatomy, pathology/pathophysiology, natural history, and prognosis of PAD/CLI.  The indications for treatment supported by updated guidelines1, 2 were discussed.  Regarding endovascular options, specific risks discussed include: bleeding, infection, contrast reaction, renal injury/nephropathy, arterial injury/dissection, need for additional procedure/surgery, worsening symptoms/tissue including limb loss, cardiopulmonary collapse, death.    Regarding medical management, maximal medical therapy for reduction of risk factors is indicated as recommended by updated AHA guidelines1.  This includes anti-platelet medication, tight blood glucose control to a HbA1c < 7, tight blood pressure control, maximum-dose HMG-CoA reductase inhibitor, and smoking cessation.  Smoking cessation was addressed, with strategies including counseling and nicotine replacement.    She tells me that she is very motivated to feel better and be more mobile, though she has some anxiety and reservations about the procedure.  She would again like to discuss with her daughter before proceeding.    Plan: - Our recommendation is for lower extremity angiogram and possible intervention of her left sided PAD.  She has some reservations, and though is motivated to be treated, would like to discuss with her daughter.  She will follow up with our clinic when she decides.  - Continued maximal medical therapy for cardiovascular risk  reduction, including anti-platelet therapy. -Smoking cessation measures, including marijuana, with options discussed.      ___________________________________________________________________   1Morley Kos MD, et al. 2016 AHA/ACC Guideline on the Management of Patients With Lower Extremity Peripheral Artery Disease: Executive Summary: A Report of the American College of Cardiology/American Heart Association Task Force on Clinical Practice Guidelines. J Am Coll Cardiol. 2017 Mar 21;69(11):1465-1508. doi: 10.1016/j.jacc.2016.11.008.   2 - Norgren L, et al. TASC II Working Group. Inter-society consensus for the management of peripheral arterial disease. Int Tressia Miners. 2007 Jun;26(2):81-157. Review. PubMed PMID: 28786767  3 - Hingorani A, et al. The management of diabetic foot: A clinical practice guideline by the Society for Vascular Surgery in collaboration with the [place] and the Society  for Vascular Medicine. J Vasc Surg. 2016 Feb;63(2 Suppl):3S-21S. doi: 10.1016/j.jvs.2015.10.003. PubMed PMID: 20947096.  4 - Corinna Gab, Saab FA, Luberta Mutter, Grant Ruts, Ewell Poe, Driver VR, Reece Agar, Altamese Cabal  Baldemar Lenis, Jaff MR, Guadalupe Dawn, Henao S, AlMahameed A, Katzen B. Digital Subtraction Angiography Prior to an Amputation for Critical Limb Ischemia (CLI): An Expert Recommendation Statement From the CLI Global Society to Optimize Limb Salvage. J Endovasc Ther. 2020 Aug;27(4):540-546. doi: 10.1177/1526602820928590. Epub 2020 May 29. PMID: 84665993.    Electronically Signed: Corrie Mckusick 03/25/2021, 4:07 PM   I spent a total of    25 Minutes in remote  clinical consultation, greater than 50% of which was counseling/coordinating care for left lower extremity Rutherford 4 category symptoms of CLI/PAD, possible angiogram and intervention. .    Visit type: Audio only (telephone). Audio (no video) only due to patient's lack of internet/smartphone  capability. Alternative for in-person consultation at [facility], [address] [place], [place], [place]. This visit type was conducted due to national recommendations for restrictions regarding the COVID-19 Pandemic (e.g. social distancing).  This format is felt to be most appropriate for this patient at this time.  All issues noted in this document were discussed and addressed.

## 2021-03-26 ENCOUNTER — Other Ambulatory Visit: Payer: Self-pay | Admitting: Interventional Radiology

## 2021-03-26 DIAGNOSIS — I739 Peripheral vascular disease, unspecified: Secondary | ICD-10-CM

## 2021-03-29 ENCOUNTER — Ambulatory Visit
Admission: RE | Admit: 2021-03-29 | Discharge: 2021-03-29 | Disposition: A | Discharge status: Home / Self Care | Payer: Medicare Other | Source: Ambulatory Visit | Attending: Interventional Radiology | Admitting: Interventional Radiology

## 2021-03-29 ENCOUNTER — Other Ambulatory Visit: Payer: Self-pay

## 2021-03-29 DIAGNOSIS — I739 Peripheral vascular disease, unspecified: Secondary | ICD-10-CM

## 2021-03-30 ENCOUNTER — Other Ambulatory Visit: Payer: Self-pay

## 2021-03-30 ENCOUNTER — Encounter: Payer: Self-pay | Admitting: *Deleted

## 2021-03-30 ENCOUNTER — Ambulatory Visit
Admission: RE | Admit: 2021-03-30 | Discharge: 2021-03-30 | Disposition: A | Discharge status: Home / Self Care | Payer: Medicare Other | Source: Ambulatory Visit | Attending: Interventional Radiology | Admitting: Interventional Radiology

## 2021-03-30 HISTORY — PX: IR RADIOLOGIST EVAL & MGMT: IMG5224

## 2021-03-30 NOTE — Progress Notes (Signed)
Chief Complaint: Left foot pain, PAD   Referring Physician(s): Betrice Wanat  History of Present Illness: Jacqueline Hunter is a 65 y.o. female referred with left leg pain and diagnosis of PAD.   We first met Jacqueline Hunter 06/17/20, with a planned angiogram 5/25 for which she left AMA.  We then had her back in the clinic 03/25/21 to discuss.   Today, we spoke with her daughter, who is her main care provider, regarding the diagnosis and recommendations.  We spoke via telemedicine visit, and we confirmed Jacqueline Hunter identity with 2 personal identifiers.   Our complete previous consult is available on 11/3 and on 1/26.    Notably, no change in her health since 11/3     Past Medical History:  Diagnosis Date   Medical history non-contributory    Mental disorder    Schizophrenia, schizo-affective type, depressed ([place])    Sickle cell trait ([place])     Past Surgical History:  Procedure Laterality Date   IR RADIOLOGIST EVAL & MGMT  06/17/2020   IR RADIOLOGIST EVAL & MGMT  03/30/2021   NO PAST SURGERIES      Allergies: Patient has no known allergies.  Medications: Prior to Admission medications   Medication Sig Start Date End Date Taking? Authorizing Provider  ibuprofen (ADVIL) 600 MG tablet Take 1 tablet (600 mg total) by mouth every 6 (six) hours as needed. Patient not taking: No sig reported 07/21/20   Pearson Forster, NP  tiZANidine (ZANAFLEX) 4 MG capsule Take 1 capsule (4 mg total) by mouth 3 (three) times daily as needed for muscle spasms. Patient not taking: No sig reported 07/21/20   Pearson Forster, NP     No family history on file.  Social History   Socioeconomic History   Marital status: Single    Spouse name: Not on file   Number of children: Not on file   Years of education: Not on file   Highest education level: Not on file  Occupational History   Not on file  Tobacco Use   Smoking status: Every Day    Packs/day: 1.00    Types: Cigarettes   Smokeless  tobacco: Never  Substance and Sexual Activity   Alcohol use: Yes   Drug use: Yes    Types: Marijuana   Sexual activity: Not Currently  Other Topics Concern   Not on file  Social History Narrative   Not on file   Social Determinants of Health   Financial Resource Strain: Not on file  Food Insecurity: Not on file  Transportation Needs: Not on file  Physical Activity: Not on file  Stress: Not on file  Social Connections: Not on file      Review of Systems  Review of Systems: A 12 point ROS discussed and pertinent positives are indicated in the HPI above.  All other systems are negative.  Physical Exam No direct physical exam was performed (except for noted visual exam findings with Video Visits).    Vital Signs: There were no vitals taken for this visit.  Imaging: IR Radiologist Eval & Mgmt  Result Date: 03/30/2021 Please refer to notes tab for details about interventional procedure. (Op Note)   Labs:  CBC: No results for input(s): WBC, HGB, HCT, PLT in the last 8760 hours.  COAGS: No results for input(s): INR, APTT in the last 8760 hours.  BMP: No results for input(s): NA, K, CL, CO2, GLUCOSE, BUN, CALCIUM, CREATININE, GFRNONAA, GFRAA in the last 8760  hours.  Invalid input(s): CMP  LIVER FUNCTION TESTS: No results for input(s): BILITOT, AST, ALT, ALKPHOS, PROT, ALBUMIN in the last 8760 hours.  TUMOR MARKERS: No results for input(s): AFPTM, CEA, CA199, CHROMGRNA in the last 8760 hours.  Assessment and Plan:   Assessment:  Jacqueline Hunter is a 65yo female with Rutherford 4 class symptoms of PAD/CLI,    I had a lengthy discussion with her daughter regarding anatomy, pathology/pathophysiology, natural history, and prognosis of PAD/CLI.  Informed consent regarding treatment strategies was performed which would possibly include medical management only, a walking program only, surgical strategy, and/or endovascular options and initiating exercise/walking, with  risk/benefit discussion.  The indications for treatment supported by updated guidelines1, 2 were discussed.  We discussed medical care for her longevity/prognosis, and any possible intervention from the standpoint of QOL.   Regarding endovascular options, specific risks discussed with her daughter, Marcille Blanco, include: bleeding, infection, contrast reaction, renal injury/nephropathy, arterial injury/dissection, need for additional procedure/surgery, worsening symptoms/tissue including limb loss, cardiopulmonary collapse, death.    Regarding medical management, maximal medical therapy for reduction of risk factors is indicated and was discussed at length, as recommended by updated AHA guidelines1.  This includes anti-platelet medication initiation, tight blood glucose control to a HbA1c < 7 (of which she currently has no diagnosis), tight blood pressure control, as well as potentially starting ACE/ARB based on HOPE study outcomes, maximum-dose HMG-CoA reductase inhibitor, and smoking cessation.  Smoking cessation was addressed, including marijuana cessation.  I did emphasize that the AHA generally endorses the concept that CV events (stroke, MI, leg events) are significantly higher with habitual users of marijuana.  Strategies include counseling, varenicline, and nicotine replacement     She does understand that our recommendation would be for angiogram and possible intervention if she has the desire for decreasing her leg pain, improving quality of life, and feeling better.  I also said that we could start with supervised exercise program if she likes to be conservative.   Plan: - She understands our above recommendations after discussion, and the family would like to discuss.   They said they would like to call us back at the clinic about any decisions.  -Recommend maximal medical therapy for cardiovascular risk reduction, including anti-platelet therapy, as above -Recommend initiating smoking cessation  measures, with options discussed.    ___________________________________________________________________   1Morley Kos MD, et al. 2016 AHA/ACC Guideline on the Management of Patients With Lower Extremity Peripheral Artery Disease: Executive Summary: A Report of the American College of Cardiology/American Heart Association Task Force on Clinical Practice Guidelines. J Am Coll Cardiol. 2017 Mar 21;69(11):1465-1508. doi: 10.1016/j.jacc.2016.11.008.   2 - Norgren L, et al. TASC II Working Group. Inter-society consensus for the management of peripheral arterial disease. Int Tressia Miners. 2007 Jun;26(2):81-157. Review. PubMed PMID: 16384665  3 - Hingorani A, et al. The management of diabetic foot: A clinical practice guideline by the Society for Vascular Surgery in collaboration with the [place] and the Society  for Vascular Medicine. J Vasc Surg. 2016 Feb;63(2 Suppl):3S-21S. doi: 10.1016/j.jvs.2015.10.003. PubMed PMID: 99357017.  4 - Corinna Gab, Saab FA, Luberta Mutter, Grant Ruts, Ewell Poe, Driver VR, [place], Lookstein R, van den Baldemar Lenis, Jaff MR, Guadalupe Dawn, Henao S, AlMahameed A, Katzen B. Digital Subtraction Angiography Prior to an Amputation for Critical Limb Ischemia (CLI): An Expert Recommendation Statement From the CLI Global Society to Optimize Limb Salvage. J Endovasc Ther. 2020 Aug;27(4):540-546. doi: 10.1177/1526602820928590. Epub 2020 May 29. PMID: 79390300.  Electronically Signed: Corrie Mckusick 03/30/2021, 4:31 PM   I spent a total of    25 Minutes in remote  clinical consultation, greater than 50% of which was counseling/coordinating care for PAD/CLI, possible angiogram/intervention. .    Visit type: Audio only (telephone). Audio (no video) only due to patient's lack of internet/smartphone capability. Alternative for in-person consultation at [facility], [address] [place], [place], [place]. This visit type was conducted due to national  recommendations for restrictions regarding the COVID-19 Pandemic (e.g. social distancing).  This format is felt to be most appropriate for this patient at this time.  All issues noted in this document were discussed and addressed.

## 2022-01-21 ENCOUNTER — Other Ambulatory Visit: Payer: Self-pay | Admitting: Family Medicine

## 2022-01-21 DIAGNOSIS — I739 Peripheral vascular disease, unspecified: Secondary | ICD-10-CM

## 2023-02-08 ENCOUNTER — Encounter: Payer: Self-pay | Admitting: Family Medicine

## 2023-02-09 ENCOUNTER — Encounter: Payer: Self-pay | Admitting: Family Medicine

## 2023-02-09 ENCOUNTER — Other Ambulatory Visit: Payer: Self-pay | Admitting: Family Medicine

## 2023-02-09 DIAGNOSIS — R0989 Other specified symptoms and signs involving the circulatory and respiratory systems: Secondary | ICD-10-CM

## 2023-02-27 ENCOUNTER — Other Ambulatory Visit: Payer: Self-pay | Admitting: Family Medicine

## 2023-02-27 DIAGNOSIS — R0989 Other specified symptoms and signs involving the circulatory and respiratory systems: Secondary | ICD-10-CM

## 2023-02-28 ENCOUNTER — Inpatient Hospital Stay: Admission: RE | Admit: 2023-02-28 | Payer: 59 | Source: Ambulatory Visit

## 2023-03-31 ENCOUNTER — Ambulatory Visit
Admission: RE | Admit: 2023-03-31 | Discharge: 2023-03-31 | Disposition: A | Discharge status: Home / Self Care | Payer: 59 | Source: Ambulatory Visit | Attending: Family Medicine | Admitting: Family Medicine

## 2023-03-31 DIAGNOSIS — R0989 Other specified symptoms and signs involving the circulatory and respiratory systems: Secondary | ICD-10-CM
